# Patient Record
Sex: Male | Born: 1950 | Race: White | Hispanic: No | Marital: Married | Smoking: Never smoker
Health system: Southern US, Community
[De-identification: ages and names within clinical notes are randomized; demographics above are authoritative.]

## PROBLEM LIST (undated history)

## (undated) DIAGNOSIS — S6290XA Unspecified fracture of unspecified wrist and hand, initial encounter for closed fracture: Secondary | ICD-10-CM

## (undated) DIAGNOSIS — E119 Type 2 diabetes mellitus without complications: Secondary | ICD-10-CM

## (undated) DIAGNOSIS — E785 Hyperlipidemia, unspecified: Secondary | ICD-10-CM

## (undated) DIAGNOSIS — I4510 Unspecified right bundle-branch block: Secondary | ICD-10-CM

## (undated) DIAGNOSIS — I10 Essential (primary) hypertension: Secondary | ICD-10-CM

## (undated) DIAGNOSIS — C61 Malignant neoplasm of prostate: Secondary | ICD-10-CM

## (undated) DIAGNOSIS — M109 Gout, unspecified: Secondary | ICD-10-CM

## (undated) HISTORY — PX: COLONOSCOPY, DIAGNOSTIC (SCREENING): SHX174

## (undated) HISTORY — DX: Unspecified fracture of unspecified wrist and hand, initial encounter for closed fracture: S62.90XA

## (undated) HISTORY — DX: Essential (primary) hypertension: I10

## (undated) HISTORY — DX: Unspecified right bundle-branch block: I45.10

## (undated) HISTORY — DX: Gout, unspecified: M10.9

## (undated) HISTORY — PX: ARTHROSCOPY KNEE, MENISCUS REPAIR: SHX51041

## (undated) HISTORY — DX: Malignant neoplasm of prostate: C61

## (undated) HISTORY — PX: EGD: SHX3789

## (undated) HISTORY — DX: Hyperlipidemia, unspecified: E78.5

## (undated) HISTORY — DX: Type 2 diabetes mellitus without complications: E11.9

---

## 1997-09-24 ENCOUNTER — Ambulatory Visit: Admission: RE | Admit: 1997-09-24 | Payer: Self-pay | Source: Ambulatory Visit | Admitting: Gastroenterology

## 2005-03-11 ENCOUNTER — Ambulatory Visit
Admit: 2005-03-11 | Disposition: A | Discharge status: Home / Self Care | Payer: Self-pay | Source: Ambulatory Visit | Admitting: Orthopaedic Surgery

## 2006-02-04 ENCOUNTER — Ambulatory Visit
Admit: 2006-02-04 | Disposition: A | Discharge status: Home / Self Care | Payer: Self-pay | Source: Ambulatory Visit | Admitting: Orthopaedic Surgery

## 2010-10-29 ENCOUNTER — Ambulatory Visit
Admit: 2010-10-29 | Disposition: A | Discharge status: Home / Self Care | Payer: Self-pay | Source: Ambulatory Visit | Admitting: Internal Medicine

## 2014-01-15 ENCOUNTER — Encounter (INDEPENDENT_AMBULATORY_CARE_PROVIDER_SITE_OTHER): Payer: Self-pay | Admitting: Cardiovascular Disease

## 2014-02-05 ENCOUNTER — Ambulatory Visit (INDEPENDENT_AMBULATORY_CARE_PROVIDER_SITE_OTHER): Payer: BLUE CROSS/BLUE SHIELD | Admitting: Cardiovascular Disease

## 2014-02-05 ENCOUNTER — Encounter (INDEPENDENT_AMBULATORY_CARE_PROVIDER_SITE_OTHER): Payer: Self-pay | Admitting: Cardiovascular Disease

## 2014-02-05 ENCOUNTER — Other Ambulatory Visit (INDEPENDENT_AMBULATORY_CARE_PROVIDER_SITE_OTHER): Payer: Self-pay | Admitting: Cardiovascular Disease

## 2014-02-05 VITALS — BP 180/80 | HR 76 | Ht 74.0 in | Wt 221.0 lb

## 2014-02-05 DIAGNOSIS — E119 Type 2 diabetes mellitus without complications: Secondary | ICD-10-CM | POA: Insufficient documentation

## 2014-02-05 DIAGNOSIS — I10 Essential (primary) hypertension: Secondary | ICD-10-CM

## 2014-02-05 DIAGNOSIS — I4510 Unspecified right bundle-branch block: Secondary | ICD-10-CM

## 2014-02-05 DIAGNOSIS — R079 Chest pain, unspecified: Secondary | ICD-10-CM

## 2014-02-05 NOTE — Progress Notes (Signed)
[facility] CONSULTATION    I had the pleasure of seeing Mr. Jagoda today for cardiovascular evaluation. He is a pleasant 63 y.o. male with a history of labile hypertension, type 2 diabetes, gout, and a history of a right bundle branch block. He is here for discussion regarding his hypertension and chest discomfort symptoms. He brings with him extensive notes over the past 15 years.    He tells me that in 2000, he was told of labile hypertension but it was only intermittent and he was not started on medications. Notably, in 2012, he had a stress test or his systolic blood pressure reached 239 mmHg with exercise. The stress echo itself was within normal limits. He was not started on blood pressure medication at that time, however. In 2013, when he was diagnosed with diabetes he was also started on lisinopril but the dose was quickly escalated to a maximum. In November of 2014, he was started on metoprolol and his dose was also doubled but still without significant change in his blood pressure. Now, his endocrinologist would like to add HCTZ to his regimen and he is reluctant to do so and he is worried about about why his blood pressure is so difficult to control.    He tells me that he has a home blood pressure cuff that has been calibrated and he gets results similar to what he is seen at doctor's offices which tend to be with systolic blood pressures in the 150s and 160s. He also reports occasional chest discomfort symptoms. He reports it as a squeezing or pressure like sensation that typically occurs at rest but may be more notable on days when he exercises at the gym. He denies that he has the symptoms while on the treadmill for 20 minutes, however. It occurs once a week on average and not every time he exercises. He last for seconds only. He had a similar chest discomfort in 2012 which prompted a stress echocardiogram which was reportedly normal.    PAST MEDICAL HISTORY: He has a past  medical history of Diabetes mellitus; Hypertension; Gout; and RBBB. He has past surgical history that includes EGD; ARTHROSCOPY KNEE, MENISCUS REPAIR; and Colonoscopy.    MEDICATIONS:   Current Outpatient Prescriptions   Medication Sig Dispense Refill   . allopurinol (ZYLOPRIM) 300 MG tablet Take 300 mg by mouth daily.     Marland Kitchen GLIPIZIDE PO Take 4 mg by mouth 2 (two) times daily.     Marland Kitchen lisinopril (PRINIVIL,ZESTRIL) 40 MG tablet Take 40 mg by mouth daily.     . metFORMIN (GLUCOPHAGE-XR) 500 MG 24 hr tablet Take 1,000 mg by mouth 2 (two) times daily.     . metoprolol XL (TOPROL-XL) 50 MG 24 hr tablet Take 50 mg by mouth 2 (two) times daily.       No current facility-administered medications for this visit.       ALLERGIES:   Allergies   Allergen Reactions   . Penicillins        FAMILY HISTORY: His family history includes Lymphoma (age of onset: 59) in his father.    SOCIAL HISTORY: He reports that he has never smoked. He has never used smokeless tobacco. He reports that he drinks about 4.8 oz of alcohol per week. He reports that he does not use illicit drugs.    REVIEW OF SYSTEMS: All other systems reviewed and negative except as stated above.     PHYSICAL EXAMINATION  General Appearance:  A well-appearing  male in no acute distress.    Vital Signs: BP 180/80 mmHg  Pulse 76  Ht 1.88 m (6\' 2" )  Wt 100.245 kg (221 lb)  BMI 28.36 kg/m2   HEENT: Sclera anicteric, conjunctiva without pallor, moist mucous membranes, normal dentition. No arcus.   Neck:  Supple without jugular venous distention. Thyroid nonpalpable. Normal carotid upstrokes without bruits.   Chest: Clear to auscultation bilaterally with good air movement and respiratory effort and no wheezes, rales, or rhonchi   Cardiovascular: RRR, normal S1 and physiologically split S2 without murmurs, gallops or rub. PMI of normal size and nondisplaced.   Abdomen: Soft, nontender, nondistended, with normoactive bowel sounds. No organomegaly.  No pulsatile masses, or  bruits.   Extremities: Warm without edema, clubbing, or cyanosis. All peripheral pulses are full and equal.   Skin: No rash, xanthoma or xanthelasma.   Neuro: Alert and oriented x3. Grossly intact. Strength is symmetrical. Normal mood and affect.     ECG: normal sinus rhythm, left axis deviation, right bundle branch block with nonspecific ST changes, no previous is available for comparison.    LABS: 12/16/13: Total cholesterol 150, triglycerides 74, HDL 50, LDL 85, glucose 118, BUN 15, creatinine 0.9, sodium 141, potassium 4.2, hemoglobin A1c 6.5%    STRESS ECHOCARDIOGRAM: 10/29/10:  1. Excellent functional capacity.  2. No chest pain with exercise.  3. 1.5 mm of up sloping ST depression noted with exercise.  4. Baseline right bundle-branch block with repolarization abnormality.  Non- diagnostic EKG changes.    CONCLUSION:  1. Baseline echo images showed normal LV function with no regional wall  motion abnormalities. EF 60-65%.  2. With exercise, all walls recruited appropriately. Hyperdynamic LV  function with an EF of 80-85%.  3. Normal treadmill stress echocardiogram with no evidence of ischemia    IMPRESSION/RECOMMENDATIONS: Mr. Huaracha is a 63 y.o. male with CAD risk factors including age, hypertension, and diabetes who presents with difficult to control hypertension and atypical chest discomfort symptoms. Despite going on increasing doses of medications, he still has not had optimal control. He has not had a secondary workup to date. At this time I've asked him to do so to include a renal artery ultrasound and laboratory studies. In the event that they are within normal limits, we will assume that this is essential hypertension which is labile. I do feel that some of this may be tied into his emotions, however he reports that they are still elevated at home.    His chest discomfort is atypical in that it occurs at rest and not with exertion and last for seconds only. He does have a right bundle branch block which  eventually a stress echocardiogram in 2012. I've asked him to do a nuclear stress test for further evaluation. On examination today, he did have different blood pressures in each arm with the left arm eating significantly lower at 140/80 versus the right arm at 180/80 mmHg. These were checked 3 times still showed a difference. I've asked him to complete a chest CT scan with contrast to rule out dissection which also may be causing his chest discomfort symptoms.    In the interim he is going to take the HCTZ and follow his blood pressure in both arms at home. We will follow up in one month at which time we will discuss these results and any further steps.    PLAN:  1. Renal artery ultrasound  2. Laboratory studies for secondary hypertension  3. Nuclear stress test  4. CT angiogram to rule out dissection  5. Patient to start taking HCTZ  6. Follow blood pressures twice daily in both arms and record  7. Office visit in one month

## 2014-02-10 ENCOUNTER — Inpatient Hospital Stay
Admission: RE | Admit: 2014-02-10 | Discharge: 2014-02-10 | Disposition: A | Discharge status: Home / Self Care | Payer: BLUE CROSS/BLUE SHIELD | Source: Ambulatory Visit | Attending: Cardiovascular Disease | Admitting: Cardiovascular Disease

## 2014-02-10 VITALS — Ht 74.02 in | Wt 223.1 lb

## 2014-02-10 DIAGNOSIS — R079 Chest pain, unspecified: Secondary | ICD-10-CM | POA: Insufficient documentation

## 2014-02-10 DIAGNOSIS — I4510 Unspecified right bundle-branch block: Secondary | ICD-10-CM

## 2014-02-10 MED ORDER — TECHNETIUM TC 99M TETROFOSMIN INJECTION
1.0000 | Freq: Once | Status: AC | PRN
Start: 2014-02-10 — End: 2014-02-10
  Administered 2014-02-10: 1 via INTRAVENOUS
  Filled 2014-02-10: qty 1

## 2014-02-10 NOTE — Procedures (Signed)
Re: Noah Wells Sex: Male      DOB: 1951/05/05      Study #  [id number]   Referring Physician:  Cheri Kearns, MD       Rendering Physician:  Payton Doughty, MD  Test Date:  02/10/2014    Interpretation Date: 02/10/2014   Reason for the study:  Chest pain.    Myoview Perfusion Study:  The patient was injected with 10.87 mCi of Myoview (IV Location (Rest): Rt/AC) and SPECT imaging was begun approximately 30 minutes post injection. The camera obtained images with a 180-degree orbit around the patient's heart. After the rest images, the patient was then prepped for an exercise stress test. One minute prior to the termination of exercise, the patient was injected intravenously with 38.2 mCi of Myoview (IV Location (Stress): Rt/AC). Gated SPECT imaging was started at least 30 minutes post injection. The patient was placed in the supine position and the images were obtained with a 180-degree orbit around the patient's heart while gating to the patient's ECG. The study was then processed and displayed for evaluation.    Exercise:  Resting ECG:  Normal sinus rhythm; right bundle branch block.  Blood Pressure:  Rest = 120/84  Maximum = 188/68  Heart Rate:   Rest = 62  Maximum = 151   Heart rate 151 (95% of MPHR) at time of isotope injection.  Exercise stopped because of fatigue.  No chest pain, arrhythmias, or ischemic ECG changes.  Negative maximal treadmill exercise stress test.    Scintigraphic Interpretation:  The study is of good technical quality, with no evidence of significant motion artifact nor of significant soft tissue attenuation artifact.  At rest, there appears to be normal and symmetric distribution of activity throughout the left ventricular myocardium.  Following exercise, the images appear similar to those at rest.  Left ventricular size is normal.  Left ventricular ejection fraction is normal (51% at rest and 57% post exercise, normal being 50% or greater).  No regional wall motion abnormalities are  evident.    Conclusions:  1.  Normal scintigraphy, without evidence of fixed or inducible perfusion defects.  2.  Normal left ventricular size.  3.  Normal left ventricular ejection fraction (51%), without regional wall motion abnormalities.  4.  No prior studies are available for comparison.    Interpreted by: Payton Doughty, MD, [facility], [facility]  Myoview Activity: 10.87 mCi at [phone] (IV Location (Rest): Rt/AC)   Myoview Activity: 38.2 mCi at 0930 (IV Location (Stress): Rt/AC)

## 2014-02-17 ENCOUNTER — Telehealth (INDEPENDENT_AMBULATORY_CARE_PROVIDER_SITE_OTHER): Payer: Self-pay

## 2014-02-17 NOTE — Telephone Encounter (Signed)
Nuclear stress test- Normal study       Pt aware

## 2014-02-18 ENCOUNTER — Ambulatory Visit
Admission: RE | Admit: 2014-02-18 | Discharge: 2014-02-18 | Disposition: A | Discharge status: Home / Self Care | Payer: BLUE CROSS/BLUE SHIELD | Source: Ambulatory Visit | Attending: Cardiovascular Disease | Admitting: Cardiovascular Disease

## 2014-02-18 ENCOUNTER — Ambulatory Visit (HOSPITAL_BASED_OUTPATIENT_CLINIC_OR_DEPARTMENT_OTHER)
Admission: RE | Admit: 2014-02-18 | Discharge: 2014-02-18 | Disposition: A | Discharge status: Home / Self Care | Payer: BLUE CROSS/BLUE SHIELD | Source: Ambulatory Visit | Attending: Cardiovascular Disease | Admitting: Cardiovascular Disease

## 2014-02-18 DIAGNOSIS — I10 Essential (primary) hypertension: Secondary | ICD-10-CM | POA: Insufficient documentation

## 2014-02-18 DIAGNOSIS — R079 Chest pain, unspecified: Secondary | ICD-10-CM | POA: Insufficient documentation

## 2014-02-18 DIAGNOSIS — I77819 Aortic ectasia, unspecified site: Secondary | ICD-10-CM | POA: Insufficient documentation

## 2014-02-18 LAB — CORTISOL: Cortisol: 7.4 ug/dL

## 2014-02-18 LAB — PROLACTIN: Prolactin: 5.6 ng/mL (ref 3.5–19.4)

## 2014-02-18 LAB — TSH: TSH: 2.26 u[IU]/mL (ref 0.35–4.94)

## 2014-02-18 MED ORDER — IOHEXOL 350 MG/ML IV SOLN
94.0000 mL | Freq: Once | INTRAVENOUS | Status: AC | PRN
Start: 2014-02-18 — End: 2014-02-18
  Administered 2014-02-18: 94 mL via INTRAVENOUS

## 2014-02-19 ENCOUNTER — Telehealth (INDEPENDENT_AMBULATORY_CARE_PROVIDER_SITE_OTHER): Payer: Self-pay

## 2014-02-19 NOTE — Telephone Encounter (Signed)
-----   Message from Cheri Kearns, MD sent at 02/18/2014  3:47 PM EDT -----  Secondary work up labs for HTN, so far negative  ----- Message -----     From: Leory Plowman, [facility] [place]     Sent: 02/18/2014   2:44 PM       To: Cheri Kearns, MD

## 2014-02-19 NOTE — Telephone Encounter (Signed)
-----   Message from Cheri Kearns, MD sent at 02/18/2014  1:05 PM EDT -----  No renal artery stenosis.  No dissection.  Mildly dilated aorta.  No change in regimen for now.  ----- Message -----     From: Interface, Rad Results In     Sent: 02/18/2014  10:45 AM       To: Cheri Kearns, MD

## 2014-02-19 NOTE — Telephone Encounter (Signed)
Pt aware.

## 2014-02-20 LAB — ALDOSTERONE: Aldosterone, LC/MS/MS: <1 ng/dL

## 2014-02-21 LAB — ALDOSTERONE/PLASMA RENIN ACTIVITY RATIO
ALDO/PRA Ratio: 8 (ref 0.9–28.9)
Aldosterone: 2 ng/dL
PRA, LC/MS/MS: 0.25 (ref 0.25–5.82)

## 2014-02-21 LAB — CATECHOLAMINES, FRACTIONATED, PLASMA
Catecholamines, Total: 745 pg/mL
Epinephrine: 41 pg/mL
Norepinephrine: 704 pg/mL

## 2014-02-26 ENCOUNTER — Encounter (INDEPENDENT_AMBULATORY_CARE_PROVIDER_SITE_OTHER): Payer: Self-pay | Admitting: Cardiovascular Disease

## 2014-02-26 ENCOUNTER — Ambulatory Visit (INDEPENDENT_AMBULATORY_CARE_PROVIDER_SITE_OTHER): Payer: BLUE CROSS/BLUE SHIELD | Admitting: Cardiovascular Disease

## 2014-02-26 VITALS — BP 136/80 | HR 68 | Wt 220.6 lb

## 2014-02-26 DIAGNOSIS — I10 Essential (primary) hypertension: Secondary | ICD-10-CM

## 2014-02-26 NOTE — Progress Notes (Signed)
[facility]SULT[facility]    I had the pleasure of seeing Mr. Swaim today for cardiovascular evaluation. He is a pleasant 63 y.o. male with a history of labile hypertension, type 2 diabetes, gout, and a history of a right bundle branch block. He presented for evaluation and assistance with controlling his blood pressure as well as chest discomfort symptoms.we completed a number of tests and he presents today for followup.    First chest discomfort, he had a nuclear stress test that returned normal without evidence of ischemia or scarring. We completed a secondary workup for hypertension including laboratory studies and a renal artery ultrasound. These all returned within normal limits. Lastly, because he had different blood pressures in each arm, he had a CT angiogram to rule out dissection. This returned without evidence of dissection but did show a moderately dilated aortic root at 4.5 cm and a mildly dilated if any aorta at 3.6 cm.     After the test results came back, he just started taking the hydrochlorothiazide last week.he tells me that he is been tolerating it well without any side effects. He is been tracking his blood pressure which has ranged from a systolic of 1:15 to 162 on one occasion. Systolic blood pressures are predominantly in the 130s, however.    During the stress test, he did not have any chest discomfort, however when under the camera but his arm lifted up he did have a pulling sensation in his left side. He did not recur.    MEDICATIONS:   Current Outpatient Prescriptions   Medication Sig Dispense Refill   . allopurinol (ZYLOPRIM) 300 MG tablet Take 300 mg by mouth daily.     Marland Kitchen GLIPIZIDE XL 5 MG 24 hr tablet Take 5 mg by mouth 2 (two) times daily.     1   . hydrochlorothiazide (HYDRODIURIL) 25 MG tablet Take 25 mg by mouth daily.     0   . lisinopril (PRINIVIL,ZESTRIL) 40 MG tablet Take 40 mg by mouth daily.     . metFORMIN (GLUCOPHAGE-XR) 500 MG 24 hr tablet Take 1,000  mg by mouth 2 (two) times daily.     . metoprolol XL (TOPROL-XL) 50 MG 24 hr tablet Take 50 mg by mouth 2 (two) times daily.       No current facility-administered medications for this visit.     REVIEW OF SYSTEMS: All other systems reviewed and negative except as stated above.     PHYSICAL EXAMINATION  General Appearance:  A well-appearing male in no acute distress.    Vital Signs: BP 136/80 mmHg  Pulse 68  Wt 100.064 kg (220 lb 9.6 oz)   The remainder of the physical examination was deferred.    IMPRESSION/RECOMMENDATIONS: Mr. Burbano is a 63 y.o. male with CAD risk factors including age, hypertension, and diabetes who presented with difficult to control hypertension and atypical chest discomfort symptoms. Workup of secondary causes of hypertension was negative. A nuclear stress test was also normal. He did not have any evidence of dissection. The CT angiogram did show some secondary signs of hypertension including a dilated aortic root. That will need to be followed with serial examinations, likely echocardiograms to reduce radiation exposure.    He is only recently started taking hydrochlorothiazide and to date the blood pressures have been okay. I have not made any changes to his current regimen. It is hoped that with continued exercise and weight loss that he can stay on a stable regimen  without new additions. In the event that something more is necessary, I would likely consider amlodipine. At this time, I've held off. We will meet again in approximately 2 months at which time we will review his blood pressures and see if anything more is needed.    PLAN:  1. Continue current medication regimen  2. Track blood pressures and record  3. Office visit in 2 months

## 2014-06-04 ENCOUNTER — Encounter (INDEPENDENT_AMBULATORY_CARE_PROVIDER_SITE_OTHER): Payer: BLUE CROSS/BLUE SHIELD | Admitting: Cardiovascular Disease

## 2015-10-12 ENCOUNTER — Ambulatory Visit (INDEPENDENT_AMBULATORY_CARE_PROVIDER_SITE_OTHER): Payer: BLUE CROSS/BLUE SHIELD | Admitting: Cardiovascular Disease

## 2015-10-12 ENCOUNTER — Encounter (INDEPENDENT_AMBULATORY_CARE_PROVIDER_SITE_OTHER): Payer: Self-pay | Admitting: Cardiovascular Disease

## 2015-10-12 ENCOUNTER — Ambulatory Visit
Admission: RE | Admit: 2015-10-12 | Discharge: 2015-10-12 | Disposition: A | Discharge status: Home / Self Care | Payer: BLUE CROSS/BLUE SHIELD | Source: Ambulatory Visit | Attending: Cardiovascular Disease | Admitting: Cardiovascular Disease

## 2015-10-12 VITALS — BP 146/86 | HR 67 | Resp 16 | Ht 74.0 in | Wt 221.0 lb

## 2015-10-12 DIAGNOSIS — R9431 Abnormal electrocardiogram [ECG] [EKG]: Secondary | ICD-10-CM

## 2015-10-12 DIAGNOSIS — I77810 Thoracic aortic ectasia: Secondary | ICD-10-CM | POA: Insufficient documentation

## 2015-10-12 DIAGNOSIS — I10 Essential (primary) hypertension: Secondary | ICD-10-CM

## 2015-10-12 DIAGNOSIS — I420 Dilated cardiomyopathy: Secondary | ICD-10-CM | POA: Insufficient documentation

## 2015-10-12 DIAGNOSIS — I083 Combined rheumatic disorders of mitral, aortic and tricuspid valves: Secondary | ICD-10-CM | POA: Insufficient documentation

## 2015-10-12 NOTE — Patient Instructions (Signed)
Complete echocardiogram    Continue to watch blood pressure call if remains elevated    Calibrate home blood pressure cuff    Please have labs forwarded fax [phone]    Hydrate well    Follow up 1 month

## 2015-10-12 NOTE — Progress Notes (Signed)
[facility]SULT[facility]    I had the pleasure of seeing Noah Wells today for cardiovascular evaluation. He is a pleasant 65 y.o. male with a history of labile hypertension, type 2 diabetes, gout, and a history of a right bundle branch block. I last saw him in the summer of 2015, at which time his blood pressure was high normal and we contemplated starting a new agent versus increasing his HCTZ. He presents now for an office visit.    He tells me that all has been going well until a few months ago when he fell and broke his right wrist.  He got the last month and has been doing physical therapy.  He started noticing towards the end of last month he had numbness in his right hand and fingers.  He can reproduce it in the shower with certain movements.  He was worried about any cardiac correlation.    And then, 2 weeks ago, [facility]  After feeling tired, sleeping excessively, cold and with abdominal pain in his left lower abdomen.  At the clinic, his blood pressure was noted to be 130/50 with a heart rate of 48.  An EKG showed a right bundle branch block which was not new, but they got very excited.  He was ultimately discharged but still feels tired, cold, and has little bit of abdominal discomfort.  He admits to some diarrhea.  He denies fevers, chills or cough.  He took Tylenol one day but is not sure if it helped.  He feels that it is very mildly better now.  He denies any chest discomfort.  Despite these symptoms, he was still able to go to the gym and exercises.  He would do 20 minutes on the treadmill at 3.5 miles per hour at a 3.8 percent grade.  He also did stretching.  With these activities, he felt fine.  He denies any lower extremity edema, orthopnea or paroxysmal nocturnal dyspnea.  He has not had any palpitations.    He has been checking his blood pressures at home over the past week and they are running a little bit lower than normal with systolics in the 100s to  140s and diastolics in the 60s to 80s.  He more typically sees it closer to 140 mmHg.    MEDICATIONS:   Current Outpatient Prescriptions   Medication Sig Dispense Refill   . allopurinol (ZYLOPRIM) 300 MG tablet Take 300 mg by mouth daily.     Marland Kitchen GLIPIZIDE XL 5 MG 24 hr tablet Take 5 mg by mouth 2 (two) times daily.     1   . hydrochlorothiazide (HYDRODIURIL) 25 MG tablet Take 25 mg by mouth daily.     0   . lisinopril (PRINIVIL,ZESTRIL) 40 MG tablet Take 40 mg by mouth daily.     . metFORMIN (GLUCOPHAGE-XR) 500 MG 24 hr tablet Take 1,000 mg by mouth 2 (two) times daily.     . metoprolol (TOPROL-XL) 100 MG 24 hr tablet Take 100 mg by mouth daily.          No current facility-administered medications for this visit.     REVIEW OF SYSTEMS: All other systems reviewed and negative except as stated above.     PHYSICAL EXAMINATION  General Appearance:  A well-appearing male in no acute distress.    Vital Signs: BP 146/86 mmHg  Pulse 67  Resp 16  Ht 1.88 m (6\' 2" )  Wt 100.245 kg (221  lb)  BMI 28.36 kg/m2   HEENT: Sclera anicteric, conjunctiva without pallor, moist mucous membranes, normal dentition. No arcus.    Neck: Supple without jugular venous distention. Thyroid nonpalpable. Normal carotid upstrokes without bruits.    Chest: Clear to auscultation bilaterally with good air movement and respiratory effort and no wheezes, rales, or rhonchi    Cardiovascular: RRR, normal S1 and physiologically split S2 without murmurs, gallops or rub. PMI of normal size and nondisplaced.    Abdomen: Soft, nontender, nondistended, with normoactive bowel sounds. No organomegaly.  No pulsatile masses, or bruits.    Extremities: Warm without edema, clubbing, or cyanosis. All peripheral pulses are full and equal.    Skin: No rash, xanthoma or xanthelasma.    Neuro: Alert and oriented x3. Grossly intact. Strength is symmetrical. Normal mood and affect.     EKG: Normal sinus rhythm occasional PVC's, left axis deviation, left anterior  fascicular block, Right bundle branch block, compared to prior study 02/05/2014, PVC's noted.    Labs 09/2015: Sodium 141, potassium 4.3,glucose 196, BUN 11, Creatinine 0.9,WBC 12.5    IMPRESSION/RECOMMENDATIONS: Noah Wells is a 65 y.o. male with CAD risk factors including age, hypertension, and diabetes who presented with nonspecific symptoms.  His current symptoms almost sound viral as if he had some cold, given that he was having abdominal discomfort and diarrhea and just felt tired and fatigued.  At this time, I have recommended hydration.  We last completed a nuclear stress test 2 years ago which was within normal limits.  Given that he is able to continue to exercise without any symptoms, I am less suspicious of an ischemic cause.    He does have PVCs on his EKG today, which is new compared to previous.  I am not sure if his diarrhea is causing low potassium levels.  He tells me that he had blood work drawn a few weeks ago.  We will try and request a copy of that for records.  Lipids were also drawn at that time and we can discuss a statin. Given his diabetes, from a cardiac perspective, he should be on a statin.  Once I get his numbers, I will calculate a cardiovascular risk score, but I feel confident that with diabetes should be on something.     I have asked him to repeat an echocardiogram to follow-up on his aortic aneurysm.  If he has any aortic insufficiency.  She is also covering his home blood pressure cuff in for calibration.  He will follow-up in one month.    PLAN:  1. Echocardiogram  2.  Obtain previous labs to include lipids, and chemistry panel  3.  Continue exercising as tolerated  4.  Would strongly consider starting a statin  5.  Office visit and follow up in one month

## 2015-10-13 ENCOUNTER — Telehealth (INDEPENDENT_AMBULATORY_CARE_PROVIDER_SITE_OTHER): Payer: Self-pay

## 2015-10-13 DIAGNOSIS — E785 Hyperlipidemia, unspecified: Secondary | ICD-10-CM

## 2015-10-13 MED ORDER — ATORVASTATIN CALCIUM 20 MG PO TABS
20.0000 mg | ORAL_TABLET | Freq: Every day | ORAL | Status: DC
Start: 2015-10-13 — End: 2015-11-20

## 2015-10-13 NOTE — Telephone Encounter (Signed)
Labs reviewed-CV risk score 24%- high intensity statin    Would start Lipitor 20 mg to start  Recheck in 1 month      Pt aware

## 2015-10-19 ENCOUNTER — Encounter (INDEPENDENT_AMBULATORY_CARE_PROVIDER_SITE_OTHER): Payer: Self-pay

## 2015-11-20 ENCOUNTER — Ambulatory Visit (INDEPENDENT_AMBULATORY_CARE_PROVIDER_SITE_OTHER): Payer: BLUE CROSS/BLUE SHIELD | Admitting: Cardiovascular Disease

## 2015-11-20 ENCOUNTER — Encounter (INDEPENDENT_AMBULATORY_CARE_PROVIDER_SITE_OTHER): Payer: Self-pay | Admitting: Cardiovascular Disease

## 2015-11-20 VITALS — BP 172/94 | HR 68 | Resp 16 | Ht 74.0 in | Wt 220.0 lb

## 2015-11-20 DIAGNOSIS — E785 Hyperlipidemia, unspecified: Secondary | ICD-10-CM

## 2015-11-20 DIAGNOSIS — I10 Essential (primary) hypertension: Secondary | ICD-10-CM

## 2015-11-20 MED ORDER — ATORVASTATIN CALCIUM 20 MG PO TABS
20.0000 mg | ORAL_TABLET | Freq: Every day | ORAL | Status: DC
Start: 2015-11-20 — End: 2016-11-30

## 2015-11-20 NOTE — Progress Notes (Signed)
[facility]SULT[facility]    I had the pleasure of seeing Noah Wells today for cardiovascular evaluation. He is a pleasant 66 y.o. male with a history of labile hypertension, type 2 diabetes, gout, and a history of a right bundle branch block.  I saw him in consultation in late February, at which time he had an EKG which showed a right bundle branch block.  It was not new, but it was felt that he should have additional evaluation given his risk factors.  He also has a history of aortic root aneurysm that has not been followed and therefore we completed an echocardiogram. Based on his risk factors, I felt that he should likely be on a statin.  I asked him to repeat some laboratory studies.  He comes in today for further discussion.    When I reviewed his laboratory studies, I calculated a 10 year cardiovascular risk score.  His risk was significantly elevated at 24 percent and I recommended a statin.  He wanted to come in today for further discussion.  He brought in a 15 page PowerPoint discussion and review regarding his medical history and cholesterol history.  He just wanted to make sure that he has not been clear.    He tells me that he is seen other doctors over the course of the past few months and his blood pressures have been in the 120s over 70s.  At home, they have also been well controlled.      MEDICATIONS:   Current Outpatient Prescriptions   Medication Sig Dispense Refill   . allopurinol (ZYLOPRIM) 300 MG tablet Take 300 mg by mouth daily.     Marland Kitchen GLIPIZIDE XL 5 MG 24 hr tablet Take 5 mg by mouth 2 (two) times daily.     1   . hydrochlorothiazide (HYDRODIURIL) 25 MG tablet Take 25 mg by mouth daily.     0   . lisinopril (PRINIVIL,ZESTRIL) 40 MG tablet Take 40 mg by mouth daily.     . metFORMIN (GLUCOPHAGE-XR) 500 MG 24 hr tablet Take 1,000 mg by mouth 2 (two) times daily.     . metoprolol (TOPROL-XL) 100 MG 24 hr tablet Take 100 mg by mouth daily.          No current  facility-administered medications for this visit.     REVIEW OF SYSTEMS: All other systems reviewed and negative except as stated above.     PHYSICAL EXAMINATION  General Appearance:  A well-appearing male in no acute distress.    Vital Signs: BP 172/94 mmHg  Pulse 68  Resp 16  Ht 1.88 m (6\' 2" )  Wt 99.791 kg (220 lb)  BMI 28.23 kg/m2  SpO2 98%   The remainder of the physical examination has been deferred.    ECHO: 10/12/15:  Conclusions:     1. Normal left ventricular systolic function. Estimated ejection fraction 51.4%.     2. Mild right ventricular dilation.     3. Normal right ventricular function.     4. Mild right atrial dilation.     5. Moderate aortic root dilation. 4.3 cm     6. Mild ascending aorta dilation. 3.9 cm     7. Trace aortic regurgitation.     8. Mild mitral regurgitation.     9. Trace tricuspid regurgitation.     10. Normal estimated pulmonary artery pressure.     11. No previous study is available for comparison. However, a limited stress echo on 10/29/10  also showed normal left heart size and function.    Labs: 10/08/15: Total cholesterol 135, triglycerides 65, HDL 41, LDL 81, (10 year cardiovascular risk score 24%), normal LFTs, glucose 127, BUN 24, creatinine 0.94, sodium 141, potassium 4.3, hemoglobin A1c 6.7 percent    IMPRESSION/RECOMMENDATIONS: Noah Wells is a 65 y.o. male with CAD risk factors including age, hypertension, and diabetes who presented with nonspecific symptoms.  We spent a significant amount of time discussing his presentation and the facts.  He actually had done a good amount of research and came to the same conclusion.  After looking at both [employer] and American Heart Association guidelines, he saw that the risk was elevated.  He did require some clarification as to the reason for the statin.  After a prolonged discussion, he  appears to understand.  At this time, he is amenable to starting, but would like to do so after a business trip that he has in June.  He will likely start atorvastatin 20 mg daily at that time and have laboratory studies.  One month later.  We will discuss his results.  At that time.    His echocardiogram continues to show a fairly stable aortic root and ascending aorta.  He had previously had a CT scan that is slightly different but not significantly.  As long as his blood pressure is under control, I think that is the most important thing at this time.    PLAN:  1.  Patient to start atorvastatin as discussed  2.  Laboratory studies one month after starting statin  3.  Echocardiogram in 1 year  4.  Follow-up in approximately 4 months

## 2015-11-20 NOTE — Patient Instructions (Addendum)
Start recommended Lipitor    Follow up blood work in 1 month after starting    Repeat echocardiogram in 1 year with follow up in 1 year

## 2016-11-14 ENCOUNTER — Ambulatory Visit
Admission: RE | Admit: 2016-11-14 | Discharge: 2016-11-14 | Disposition: A | Discharge status: Home / Self Care | Payer: BLUE CROSS/BLUE SHIELD | Source: Ambulatory Visit | Attending: Cardiovascular Disease | Admitting: Cardiovascular Disease

## 2016-11-14 DIAGNOSIS — I10 Essential (primary) hypertension: Secondary | ICD-10-CM

## 2016-11-14 DIAGNOSIS — I340 Nonrheumatic mitral (valve) insufficiency: Secondary | ICD-10-CM | POA: Insufficient documentation

## 2016-11-14 DIAGNOSIS — I77810 Thoracic aortic ectasia: Secondary | ICD-10-CM | POA: Insufficient documentation

## 2016-11-14 DIAGNOSIS — I119 Hypertensive heart disease without heart failure: Secondary | ICD-10-CM | POA: Insufficient documentation

## 2016-11-15 ENCOUNTER — Other Ambulatory Visit (INDEPENDENT_AMBULATORY_CARE_PROVIDER_SITE_OTHER): Payer: Self-pay | Admitting: Cardiovascular Disease

## 2016-11-16 LAB — HEPATIC FUNCTION PANEL
ALT: 35 U/L (ref 9–46)
AST (SGOT): 28 U/L (ref 10–35)
Albumin/Globulin Ratio: 2.2 (calc) (ref 1.0–2.5)
Albumin: 4.4 g/dL (ref 3.6–5.1)
Alkaline Phosphatase: 53 U/L (ref 40–115)
Bilirubin Direct: 0.3 mg/dL — ABNORMAL HIGH (ref <=0.2)
Bilirubin Indirect: 0.9 mg/dL (calc) (ref 0.2–1.2)
Bilirubin, Total: 1.2 mg/dL (ref 0.2–1.2)
Globulin: 2 g/dL (calc) (ref 1.9–3.7)
Protein, Total: 6.4 g/dL (ref 6.1–8.1)

## 2016-11-16 LAB — LIPID PANEL
Cholesterol / HDL Ratio: 2.3 (calc) (ref <5.0)
Cholesterol: 103 mg/dL (ref <200)
HDL: 45 mg/dL (ref >40)
LDL Calculated: 42 mg/dL (calc)
NON HDL CHOLESTEROL: 58 mg/dL (calc) (ref <130)
Triglycerides: 75 mg/dL (ref <150)

## 2016-11-16 LAB — CK: Creatinine Kinase, Total: 102 U/L (ref 44–196)

## 2016-11-22 ENCOUNTER — Encounter (INDEPENDENT_AMBULATORY_CARE_PROVIDER_SITE_OTHER): Payer: BLUE CROSS/BLUE SHIELD | Admitting: Cardiovascular Disease

## 2016-11-23 ENCOUNTER — Telehealth (INDEPENDENT_AMBULATORY_CARE_PROVIDER_SITE_OTHER): Payer: Self-pay

## 2016-11-23 NOTE — Telephone Encounter (Signed)
Pt has appointment tomorrow.

## 2016-11-23 NOTE — Telephone Encounter (Signed)
-----   Message from Cheri Kearns, MD sent at 11/16/2016 10:01 AM EDT -----  Labs look great. No changes.   ----- Message -----  From: Janace Hoard Lab Results In  Sent: 11/16/2016   3:45 AM  To: Cheri Kearns, MD

## 2016-11-24 ENCOUNTER — Ambulatory Visit (INDEPENDENT_AMBULATORY_CARE_PROVIDER_SITE_OTHER): Payer: BLUE CROSS/BLUE SHIELD | Admitting: Cardiovascular Disease

## 2016-11-24 ENCOUNTER — Encounter (INDEPENDENT_AMBULATORY_CARE_PROVIDER_SITE_OTHER): Payer: Self-pay | Admitting: Cardiovascular Disease

## 2016-11-24 VITALS — BP 136/80 | HR 76 | Ht 74.0 in | Wt 224.0 lb

## 2016-11-24 DIAGNOSIS — I10 Essential (primary) hypertension: Secondary | ICD-10-CM

## 2016-11-24 DIAGNOSIS — R2681 Unsteadiness on feet: Secondary | ICD-10-CM

## 2016-11-24 DIAGNOSIS — E7800 Pure hypercholesterolemia, unspecified: Secondary | ICD-10-CM

## 2016-11-24 DIAGNOSIS — E785 Hyperlipidemia, unspecified: Secondary | ICD-10-CM | POA: Insufficient documentation

## 2016-11-24 NOTE — Progress Notes (Signed)
[facility]SULTATION    I had the pleasure of seeing [name] today for cardiovascular evaluation. He is a pleasant 66 y.o. male with a history of labile hypertension, type 2 diabetes, hyperlipidemia, gout, and a history of a right bundle branch block.  I saw him in consultation in 2015, at which time he had an EKG which showed a right bundle branch block.  A nuclear stress test was unremarkable.  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  I last saw him one year ago, at which time I recommended that he start a statin due to an elevated risk score.  He presents now for follow-up.    He tells me that he had been doing great until recently.  Since August, he is been having episodes where he feels off balance.  The episodes usually last around 2 minutes and then resolve on their own.  She reports the sudden need to search.  She feels "urge to fall down, high, and cover up".  He denies that the room is spinning or that he feels like he needs to pass out.  After about 2 minutes, his symptoms resolved.  It has occurred while walking the dog.  Sometimes he feels that his feet are wobbly and he is leaning to the left and staring at the ground.  He denies that he is dizzy or presyncopal, however.  He denies that he has associated symptoms including chest discomfort, shortness of breath, palpitations, nausea, sweatiness or appears pale.  Sometimes, there are visual triggers.  The worst episode occurred while he was on a tour in a tunnel, which was pitch black.  He felt confused and completely disoriented as to what was up down the left or right.  When he was looking up at break buildings that were lit in [place], he had a similar feeling.    Initially, he saw ENT who checked it in her ear and did a workup that was negative.  His endocrinologist did not think it was related to low sugars.  He saw a neurologist who reported  having seen a similar episode in the past.  He prescribed him Lexapro and asked him to return in a couple of weeks.  He has not had any change on Lexapro.    His symptoms are most reliably associated with walking at the [employer].  Something about the high ceilings and narrow files trigger one of these episodes.  He never had this issue in the past.  He also had an issue while in [facility]    He walks the dog for an hour a day.  He is on the treadmill for 20 minutes at 3.2 miles per hour and an 11 percent incline.  He also lifts weights.  He denies that he has any symptoms related to this.    His blood pressure is also been erratic, sometimes up, sometimes down.  Recently, his endocrinologist, double his Toprol due to elevated pressures.  His current symptoms, however predate that.  He has reported some diarrhea, but not consistent.  He started the atorvastatin in January, only and denies that his symptoms are related to it.  He did feel these symptoms prior to the initiation of atorvastatin.    MEDICATIONS:   Current Outpatient Prescriptions   Medication Sig Dispense Refill   . escitalopram (LEXAPRO) 10 MG tablet      . metoprolol succinate (TOPROL XL) 100  MG 24 hr tablet      . allopurinol (ZYLOPRIM) 300 MG tablet Take 300 mg by mouth daily.     Marland Kitchen atorvastatin (LIPITOR) 20 MG tablet Take 1 tablet (20 mg total) by mouth daily. 30 tablet 5   . GLIPIZIDE XL 5 MG 24 hr tablet Take 5 mg by mouth 2 (two) times daily.     1   . hydrochlorothiazide (HYDRODIURIL) 25 MG tablet Take 25 mg by mouth daily.     0   . lisinopril (PRINIVIL,ZESTRIL) 40 MG tablet Take 40 mg by mouth daily.     . metFORMIN (GLUCOPHAGE-XR) 500 MG 24 hr tablet Take 1,000 mg by mouth 2 (two) times daily.     . metoprolol (TOPROL-XL) 100 MG 24 hr tablet Take 100 mg by mouth daily.          No current facility-administered medications for this visit.      REVIEW OF SYSTEMS: All other systems reviewed and negative except as stated above.     PHYSICAL  EXAMINATION  General Appearance:  A well-appearing male in no acute distress.    Vital Signs: BP 166/76   Pulse 66   Ht 1.88 m (6\' 2" )   Wt 101.6 kg (224 lb)   BMI 28.76 kg/m  .  Lying: 166/96, 66.  Sitting: 158/82, 79  Standing: 136/80, 76  HEENT: Sclera anicteric, conjunctiva without pallor, moist mucous membranes, normal dentition. No arcus.    Neck: Supple without jugular venous distention. Thyroid nonpalpable. Normal carotid upstrokes without bruits.    Chest: Clear to auscultation bilaterally with good air movement and respiratory effort and no wheezes, rales, or rhonchi    Cardiovascular: RRR with occasional ectopic beats, sometimes in a pattern of bigeminy, normal S1 and physiologically split S2 without murmurs, gallops or rub. PMI diffuse.    Abdomen: Soft, nontender, nondistended, with normoactive bowel sounds. No organomegaly.  No pulsatile masses, or bruits.    Extremities: Warm without edema, clubbing, or cyanosis. All peripheral pulses are full and equal.    Skin: No rash, xanthoma or xanthelasma.    Neuro: Alert and oriented x3. Grossly intact. Strength is symmetrical. Normal mood and affect.     EKG : Normal sinus rhythm, occasional PVC, left axis deviation/left anterior fascicular block, right bundle branch block, non specific ST changes, compared to prior study 09/17/2015 no significant change     ECHO: 11/14/16:  1. Normal left ventricular systolic function. Estimated ejection fraction 57%.  2. Mild concentric left ventricular hypertrophy.  3. Grade I left ventricular diastolic dysfunction.  4. Mild-moderate aortic root dilation. 4.2 cm  5. Mild ascending aorta dilation. 3.8 cm  6. Trace aortic regurgitation.  7. Mild mitral regurgitation.  8. Trace to mild tricuspid regurgitation.  9. Compared to the prior study of 10/12/15, the left ventricle systolic function has increased from low normal to normal. The right heart size also appears normal. The aorta sizes are unchanged.    Labs: 11/16/16:  total cholesterol 103, triglycerides 75, HDL 45, LDL 42, normal LFTs, CK 102    IMPRESSION/RECOMMENDATIONS: Mr. Mizuno is a 66 y.o. male with CAD risk factors including age, hypertension, hyperlipidemia, and diabetes who presents with nonspecific symptoms.  Many of his symptoms sound neurologic.  Given that there are visual cues, and imbalance, and specifically no symptoms with exertion.  He has no prodrome including palpitations positional changes, or chest discomfort/shortness of breath.  I have asked him to wear a 48-hour Holter monitor.  During the monitoring period, he will go into [employer], which usually triggers his episodes.  We will also be able to quantify his ventricular ectopy to see if it has increased.  I am not sure that that is the source, however.    As noted above, his echocardiogram is unchanged from one year ago.  Given that he is not having episodes with exercise, I do not think a repeat stress test is warranted.  His blood pressure is high here, as it was previously.  I am unsure as to whether or not he has whitecoat hypertension.  I have asked him to check his blood pressure at home twice daily over the next week and to contact me.  In the event that he continues to have erratic, or high blood pressures, we can consider a secondary workup for hypertension.    In the interim, he will start taking enteric-coated baby aspirin daily.  He had not been doing so previously.  He will follow-up with neurology and continue testing.  I will see him thereafter.     PLAN:  1.  48 hour Holter monitor.  2.  Check blood pressures twice daily for one week.  3.  Enteric-coated baby aspirin daily.  4.  Follow-up with neurology.  5.  Follow-up here in one month.

## 2016-11-24 NOTE — Patient Instructions (Addendum)
Complete 48 hr holter monitor     Continue to follow blood pressures 2 x daily and send results     Continue to follow up with Neurology    Start Asprin enteric coated 81 mg daily      Follow up in 1 month

## 2016-11-25 ENCOUNTER — Ambulatory Visit (INDEPENDENT_AMBULATORY_CARE_PROVIDER_SITE_OTHER): Payer: BLUE CROSS/BLUE SHIELD

## 2016-11-25 DIAGNOSIS — E7800 Pure hypercholesterolemia, unspecified: Secondary | ICD-10-CM

## 2016-11-25 DIAGNOSIS — I10 Essential (primary) hypertension: Secondary | ICD-10-CM

## 2016-11-25 DIAGNOSIS — R2681 Unsteadiness on feet: Secondary | ICD-10-CM

## 2016-11-25 NOTE — Procedures (Signed)
Holter Monitor number [id number] was placed on the patient. Detailed instructions given. The patient verbalized understanding.

## 2016-11-30 ENCOUNTER — Other Ambulatory Visit (INDEPENDENT_AMBULATORY_CARE_PROVIDER_SITE_OTHER): Payer: Self-pay

## 2016-11-30 DIAGNOSIS — E785 Hyperlipidemia, unspecified: Secondary | ICD-10-CM

## 2016-11-30 DIAGNOSIS — I10 Essential (primary) hypertension: Secondary | ICD-10-CM

## 2016-11-30 MED ORDER — ATORVASTATIN CALCIUM 20 MG PO TABS
20.0000 mg | ORAL_TABLET | Freq: Every day | ORAL | 3 refills | Status: DC
Start: 2016-11-30 — End: 2018-01-03

## 2016-12-02 DIAGNOSIS — R2681 Unsteadiness on feet: Secondary | ICD-10-CM

## 2016-12-03 ENCOUNTER — Encounter (INDEPENDENT_AMBULATORY_CARE_PROVIDER_SITE_OTHER): Payer: Self-pay | Admitting: Cardiovascular Disease

## 2017-01-20 ENCOUNTER — Ambulatory Visit (INDEPENDENT_AMBULATORY_CARE_PROVIDER_SITE_OTHER): Payer: BLUE CROSS/BLUE SHIELD | Admitting: Cardiovascular Disease

## 2017-01-20 ENCOUNTER — Encounter (INDEPENDENT_AMBULATORY_CARE_PROVIDER_SITE_OTHER): Payer: Self-pay | Admitting: Cardiovascular Disease

## 2017-01-20 ENCOUNTER — Encounter (INDEPENDENT_AMBULATORY_CARE_PROVIDER_SITE_OTHER): Payer: BLUE CROSS/BLUE SHIELD | Admitting: Cardiovascular Disease

## 2017-01-20 VITALS — BP 112/56 | HR 50 | Ht 74.0 in | Wt 228.0 lb

## 2017-01-20 DIAGNOSIS — I493 Ventricular premature depolarization: Secondary | ICD-10-CM

## 2017-01-20 DIAGNOSIS — E7800 Pure hypercholesterolemia, unspecified: Secondary | ICD-10-CM

## 2017-01-20 DIAGNOSIS — I10 Essential (primary) hypertension: Secondary | ICD-10-CM

## 2017-01-20 DIAGNOSIS — I712 Thoracic aortic aneurysm, without rupture, unspecified: Secondary | ICD-10-CM

## 2017-01-20 NOTE — Progress Notes (Signed)
[facility]    I had the pleasure of seeing [name] today for cardiovascular evaluation. He is a pleasant 66 y.o. male with a history of labile hypertension, type 2 diabetes, hyperlipidemia, gout, and a history of a right bundle branch block.  I saw him in consultation in 2015, at which time he had an EKG which showed a right bundle branch block.  A nuclear stress test was unremarkable.  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  I last saw him in April, at which time he was having episodes where he did feel off balance, the need to fall down [place] and cover up.  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  As part of evaluation, had him wear a 48-hour monitor.  I also had him check his blood pressures which were elevated at the time of our last visit.  He presents now for follow-up.    The monitor showed that he was having a good burden of PVCs, up to 12 percent.  However, they did not necessarily correlate with his episodes.  He was noted to have bradycardia, but the lowest heart rate was 49 and also did not correlate with his symptoms.  He saw neurology who felt that it was related to panic attacks.  The patient had been on Lexapro but had not noticed any improvement after a month and stopped it.  He opted not to take anything else.  He did have recurrent events.  Each episode lasted about 5-10 minutes.    Despite having the heavy burden of PVCs, he denies that he feels palpitations at all.  In general, he has been feeling well.  He continues to work out on the treadmill and denies any symptoms with that.    MEDICATIONS:   Current Outpatient Prescriptions   Medication Sig Dispense Refill   . allopurinol (ZYLOPRIM) 300 MG tablet Take 300 mg by mouth daily.     Marland Kitchen aspirin EC 81 MG EC tablet Take 81 mg by mouth daily.     Marland Kitchen atorvastatin (LIPITOR) 20 MG tablet Take  1 tablet (20 mg total) by mouth daily. 90 tablet 3   . GLIPIZIDE XL 5 MG 24 hr tablet Take 10 mg by mouth 2 (two) times daily.      1   . hydrochlorothiazide (HYDRODIURIL) 25 MG tablet Take 25 mg by mouth daily.     0   . lisinopril (PRINIVIL,ZESTRIL) 40 MG tablet Take 40 mg by mouth daily.     . metFORMIN (GLUCOPHAGE-XR) 500 MG 24 hr tablet Take 1,000 mg by mouth 2 (two) times daily.     . metoprolol (TOPROL-XL) 100 MG 24 hr tablet Take 100 mg by mouth 2 (two) times daily.           No current facility-administered medications for this visit.      REVIEW OF SYSTEMS: All other systems reviewed and negative except as stated above.     PHYSICAL EXAMINATION  General Appearance:  A well-appearing male in no acute distress.    Vital Signs: BP 112/56   Pulse (!) 50   Ht 1.88 m (6\' 2" )   Wt 103.4 kg (228 lb)   SpO2 97%   BMI 29.27 kg/m    HEENT: Sclera anicteric, conjunctiva without pallor, moist mucous membranes, normal dentition. No arcus.    Neck: Supple without jugular venous  distention. Thyroid nonpalpable. Normal carotid upstrokes without bruits.    Chest: Clear to auscultation bilaterally with good air movement and respiratory effort and no wheezes, rales, or rhonchi    Cardiovascular: RRR with occasional ectopic beats, sometimes in a pattern of bigeminy, normal S1 and physiologically split S2 without murmurs, gallops or rub. PMI diffuse.    Abdomen: Soft, nontender, nondistended, with normoactive bowel sounds. No organomegaly.  No pulsatile masses, or bruits.    Extremities: Warm without edema, clubbing, or cyanosis. All peripheral pulses are full and equal.    Skin: No rash, xanthoma or xanthelasma.    Neuro: Alert and oriented x3. Grossly intact. Strength is symmetrical. Normal mood and affect.     48 HOUR HOLTER:11/25/2016-11/27/2016  1- Baseline SR with IVCD/BBB. Average heart rate was 66 bpm varying from 49 to 99 bpm. 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 Normal left ventricular systolic function. Estimated ejection fraction 57%.  2. Mild concentric left ventricular hypertrophy.  3. Grade I left ventricular diastolic dysfunction.  4. Mild-moderate aortic root dilation. 4.2 cm  5. Mild ascending aorta dilation. 3.8 cm  6. Trace aortic regurgitation.  7. Mild mitral regurgitation.  8. Trace to mild tricuspid regurgitation.  9. Compared to the prior study of 10/12/15, the left ventricle systolic function has increased from low normal to normal. The right heart size also appears normal. The aorta sizes are unchanged.    IMPRESSION/RECOMMENDATIONS: Noah Wells is a 66 y.o. male with CAD risk factors including age, hypertension, hyperlipidemia, and diabetes who presents with nonspecific symptoms.  I am in agreement that it could be related to a panic attack.  We did not note any significant arrhythmias during his typical episodes.  He has no evidence of ischemia or significant cardiomyopathy that is of concern.  He was not orthostatic on examination.  He denies any action feels lightheaded and presyncopal.  It also does not feel like vertigo.  At this time, he is to work on avoidance of his typical triggers but he is less concerned about it given that a neurologic and cardiac workup have not been fruitful.    He does have a fairly significant PVC burden to which he is asymptomatic.  He is already on a high-dose of metoprolol.  I did not make any changes for now.  I have asked him to repeat a 24-hour Holter monitor next year.  In event that he approaches 20 percent, we can pursue a PVC ablation.  Nothing is  indicated at this time.  He will also repeat an echocardiogram to follow up on his aortic aneurysms.  I will see him thereafter.    PLAN:  1.  Continue current medication regimen.  2.  24-hour Holter monitor next year.  3.  Echocardiogram next year.  4.  Follow up after testing next year.

## 2017-01-20 NOTE — Patient Instructions (Addendum)
Follow up echocardiogram in April    Diet,exercise,weight loss    Complete fasting labs in October     Follow up in April

## 2017-02-20 ENCOUNTER — Encounter (INDEPENDENT_AMBULATORY_CARE_PROVIDER_SITE_OTHER): Payer: Self-pay

## 2017-03-09 ENCOUNTER — Other Ambulatory Visit: Payer: Self-pay

## 2017-03-09 DIAGNOSIS — F0150 Vascular dementia without behavioral disturbance: Secondary | ICD-10-CM

## 2017-03-09 DIAGNOSIS — F05 Delirium due to known physiological condition: Secondary | ICD-10-CM

## 2017-03-09 DIAGNOSIS — R2689 Other abnormalities of gait and mobility: Secondary | ICD-10-CM

## 2017-03-09 DIAGNOSIS — R42 Dizziness and giddiness: Secondary | ICD-10-CM

## 2017-03-10 ENCOUNTER — Ambulatory Visit: Payer: BLUE CROSS/BLUE SHIELD

## 2017-03-10 DIAGNOSIS — R410 Disorientation, unspecified: Secondary | ICD-10-CM | POA: Insufficient documentation

## 2017-03-10 DIAGNOSIS — F0150 Vascular dementia without behavioral disturbance: Secondary | ICD-10-CM

## 2017-03-10 DIAGNOSIS — F05 Delirium due to known physiological condition: Secondary | ICD-10-CM

## 2017-03-10 DIAGNOSIS — Z8673 Personal history of transient ischemic attack (TIA), and cerebral infarction without residual deficits: Secondary | ICD-10-CM | POA: Insufficient documentation

## 2017-03-10 DIAGNOSIS — R42 Dizziness and giddiness: Secondary | ICD-10-CM | POA: Insufficient documentation

## 2017-03-10 DIAGNOSIS — R2689 Other abnormalities of gait and mobility: Secondary | ICD-10-CM

## 2017-03-10 DIAGNOSIS — I668 Occlusion and stenosis of other cerebral arteries: Secondary | ICD-10-CM | POA: Insufficient documentation

## 2017-03-23 ENCOUNTER — Ambulatory Visit
Admission: RE | Admit: 2017-03-23 | Discharge: 2017-03-23 | Disposition: A | Discharge status: Home / Self Care | Payer: BLUE CROSS/BLUE SHIELD | Source: Ambulatory Visit

## 2017-03-23 DIAGNOSIS — R2689 Other abnormalities of gait and mobility: Secondary | ICD-10-CM

## 2017-03-23 DIAGNOSIS — Z0389 Encounter for observation for other suspected diseases and conditions ruled out: Secondary | ICD-10-CM | POA: Insufficient documentation

## 2017-03-23 DIAGNOSIS — R410 Disorientation, unspecified: Secondary | ICD-10-CM

## 2017-03-23 NOTE — Procedures (Signed)
Indication / History: The patient has h/o gait abnormality, disorientation    This electroencephalogram was recorded using both referential and differential montages. Using a digital machine the 18 channel International 10-20 System of electrode placement was used.      Background: The EEG was recorded with the patient awake, drowsy and asleep. The waking background activity in the occipital lobe consists of fairly well developed medium voltage 10-11 hz. activity that attenuates with eye opening. Some eye movement artifact and low voltage fast activity is seen frontally.   Drowsiness heralded by disorganization and slowing of the background. Stage II sleep was obtained with symmetric spindle activity and vertex waves.     Abnormal activity : No epileptiform discharges, focal slowing, spike waves or clinical events noted.  Hemispheric asymmetry : none    Photic stimulation and hyperventilation produced no abnormality.    IMPRESSION: Normal study: This routine EEG recorded during wakefulness and drowsiness was within normal limits for age. There is no evidence of epileptiform activity or slowing.    Rosalio Macadamia, MD - [facility]  Available on XTEND paging  Board Certified in Neurology by ABPN  Board Certified Clinical Neurophysiology by [employer]BPN

## 2017-03-23 NOTE — Progress Notes (Signed)
EEG COMPLETED. REPORT TO FOLLOW.

## 2017-10-09 ENCOUNTER — Encounter (INDEPENDENT_AMBULATORY_CARE_PROVIDER_SITE_OTHER): Payer: Self-pay | Admitting: Cardiovascular Disease

## 2017-10-10 ENCOUNTER — Telehealth (INDEPENDENT_AMBULATORY_CARE_PROVIDER_SITE_OTHER): Payer: Self-pay

## 2017-10-10 ENCOUNTER — Encounter (INDEPENDENT_AMBULATORY_CARE_PROVIDER_SITE_OTHER): Payer: Self-pay

## 2017-10-10 NOTE — Telephone Encounter (Signed)
Error

## 2017-11-14 ENCOUNTER — Ambulatory Visit (INDEPENDENT_AMBULATORY_CARE_PROVIDER_SITE_OTHER): Payer: BLUE CROSS/BLUE SHIELD

## 2017-11-14 ENCOUNTER — Ambulatory Visit
Admission: RE | Admit: 2017-11-14 | Discharge: 2017-11-14 | Disposition: A | Discharge status: Home / Self Care | Payer: BLUE CROSS/BLUE SHIELD | Source: Ambulatory Visit | Attending: Cardiovascular Disease | Admitting: Cardiovascular Disease

## 2017-11-14 DIAGNOSIS — I712 Thoracic aortic aneurysm, without rupture, unspecified: Secondary | ICD-10-CM

## 2017-11-14 DIAGNOSIS — I517 Cardiomegaly: Secondary | ICD-10-CM | POA: Insufficient documentation

## 2017-11-14 DIAGNOSIS — I77810 Thoracic aortic ectasia: Secondary | ICD-10-CM | POA: Insufficient documentation

## 2017-11-14 DIAGNOSIS — I493 Ventricular premature depolarization: Secondary | ICD-10-CM

## 2017-11-14 DIAGNOSIS — I081 Rheumatic disorders of both mitral and tricuspid valves: Secondary | ICD-10-CM | POA: Insufficient documentation

## 2017-11-14 NOTE — Procedures (Signed)
24 hour holter monitor placed, instructions given, understood by pt

## 2017-11-21 ENCOUNTER — Encounter (INDEPENDENT_AMBULATORY_CARE_PROVIDER_SITE_OTHER): Payer: Self-pay | Admitting: Cardiovascular Disease

## 2017-11-30 ENCOUNTER — Encounter (INDEPENDENT_AMBULATORY_CARE_PROVIDER_SITE_OTHER): Payer: BLUE CROSS/BLUE SHIELD | Admitting: Cardiovascular Disease

## 2017-12-01 DIAGNOSIS — I493 Ventricular premature depolarization: Secondary | ICD-10-CM

## 2017-12-14 ENCOUNTER — Encounter (INDEPENDENT_AMBULATORY_CARE_PROVIDER_SITE_OTHER): Payer: Self-pay | Admitting: Cardiovascular Disease

## 2017-12-14 ENCOUNTER — Ambulatory Visit (INDEPENDENT_AMBULATORY_CARE_PROVIDER_SITE_OTHER): Payer: BLUE CROSS/BLUE SHIELD | Admitting: Cardiovascular Disease

## 2017-12-14 VITALS — BP 124/66 | HR 60 | Ht 74.0 in | Wt 215.0 lb

## 2017-12-14 DIAGNOSIS — I712 Thoracic aortic aneurysm, without rupture, unspecified: Secondary | ICD-10-CM

## 2017-12-14 DIAGNOSIS — E7800 Pure hypercholesterolemia, unspecified: Secondary | ICD-10-CM

## 2017-12-14 DIAGNOSIS — I10 Essential (primary) hypertension: Secondary | ICD-10-CM

## 2017-12-14 NOTE — Progress Notes (Signed)
[facility] CONSULTATION    I had the pleasure of seeing [name] today for cardiovascular evaluation. He is a pleasant 67 y.o. male with a history of labile hypertension, type 2 diabetes, hyperlipidemia, gout, and a history of a right bundle branch block.  I saw him in consultation in 2015, at which time he had an EKG which showed a right bundle branch block.  A nuclear stress test was unremarkable.  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  I last saw him in April 2018, at which time he was having episodes where he did feel off balance.  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  A 48-hour monitor and echocardiogram were unremarkable as to the cause.  I last saw him in June.  He presents today for a routine visit and follow-up of a repeat echocardiogram and Holter monitor.    He tells me that his year has been fine.  He did see a neurologist in [place] who felt that blood pressure fluctuations between his brain and his heart with a cause of his symptoms.  Not explained, however was why it was only happening when he walked into certain stores and not at other times.  No intervention was recommended.  He still has episodes but just tries to make his visits they are very brief.  He denies he is ever had any syncope.  The episodes of the last 5 or 10 minutes but he typically tries to stay calm and wait for the past.    After the holidays, he noted that his blood pressures were running high.  In January, he thought his PCP who noted blood pressures in the 150s with sugars that were also out of control.  His weight was also.  His Janumet was increased and amlodipine was added to his regimen.  Since then, his blood pressures have been well controlled.  He also feels more calm than he had previously.  He has lost about 10 pounds since he started on the Janumet.  He denies  that he is having any episodes of lightheadedness or dizziness on the medications or that his blood pressures have been too low.  He has been regularly checking the blood pressures and has been getting systolics anywhere between 101 and 124 bpm.  Diastolic blood pressures are well controlled.    He exercises 3 days a week on a treadmill for 30 minutes.  He also lifts weights for 30 minutes.  He also walks the dog for up to an hour.  He denies any symptoms or limitations.    MEDICATIONS:   Current Outpatient Prescriptions   Medication Sig Dispense Refill   . allopurinol (ZYLOPRIM) 300 MG tablet Take 300 mg by mouth daily.     Marland Kitchen amLODIPine (NORVASC) 5 MG tablet Take 5 mg by mouth daily     . aspirin EC 81 MG EC tablet Take 81 mg by mouth daily.     Marland Kitchen atorvastatin (LIPITOR) 20 MG tablet Take 1 tablet (20 mg total) by mouth daily. 90 tablet 3   . Cetirizine HCl (KLS ALLER-TEC PO) Take by mouth     . GLIPIZIDE XL 5 MG 24 hr tablet Take 10 mg by mouth 2 (two) times daily.      1   . hydrochlorothiazide (HYDRODIURIL) 25 MG tablet Take 25 mg by mouth daily.  0   . lisinopril (PRINIVIL,ZESTRIL) 40 MG tablet Take 40 mg by mouth daily.     . metoprolol (TOPROL-XL) 100 MG 24 hr tablet Take 100 mg by mouth 2 (two) times daily.         Marland Kitchen SITagliptin-metFORMIN (JANUMET) 50-1000 MG tablet Take 1 tablet by mouth 2 (two) times daily with meals       No current facility-administered medications for this visit.      REVIEW OF SYSTEMS: All other systems reviewed and negative except as stated above.     PHYSICAL EXAMINATION  General Appearance:  A well-appearing male in no acute distress.    Vital Signs: BP 124/66   Pulse 60   Ht 1.88 m (6\' 2" )   Wt 97.5 kg (215 lb)   SpO2 98%   BMI 27.60 kg/m    HEENT: Sclera anicteric, conjunctiva without pallor, moist mucous membranes, normal dentition. No arcus.    Neck: Supple without jugular venous distention. Thyroid nonpalpable. Normal carotid upstrokes without bruits.    Chest: Clear to  auscultation bilaterally with good air movement and respiratory effort and no wheezes, rales, or rhonchi    Cardiovascular: RRR with occasional ectopic beats, sometimes in a pattern of bigeminy, normal S1 and physiologically split S2 without murmurs, gallops or rub. PMI diffuse.    Abdomen: Soft, nontender, nondistended, with normoactive bowel sounds. No organomegaly.  No pulsatile masses, or bruits.    Extremities: Warm without edema, clubbing, or cyanosis. All peripheral pulses are full and equal.    Skin: No rash, xanthoma or xanthelasma.    Neuro: Alert and oriented x3. Grossly intact. Strength is symmetrical. Normal mood and affect.     24 HOUR HOLTER: 11/14/2017-11/15/2017:  1- Baseline SR with 1st degree AV block and BBB. Average heart rate was 65 bpm varying from 51 to 90 bpm.   2- Rare PACs (<150) with 8 couplets,one triplet, and two runs of PAT : a 4 beat run at 122 bpm and an 8 beat run at 114 bpm  3- Very rare bifocal PVCs (total of 19 beats(<0.1%) with 4 couplets  4- No Afib,flutter,pauses or evidence of advanced AV block  5- Total 5 patient events "palpitations" and "lightheadedness" had no EKG correlations    ECHO: 11/14/2017:  1. Mild left ventricular dilation.  2. Normal left ventricular systolic function. Estimated ejection fraction 64.8%.  3. 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 Mild ascending aorta dilation. 3.9 cm  5. Mild mitral regurgitation.  6. Mild tricuspid regurgitation.  7. Normal estimated pulmonary artery pressure.  8. Compared to the prior study of 11/14/16, the aortic root diameter has increased from 4.2 to 4.5 cm    Labs:   09/21/2017: Glucose 101, BUN 24, Creatinine 0.96, sodium 141, potassium 4.5,AST 23,ALT 27, HbA1c 7.4    06/08/2017: Total cholesterol 109, triglycerides 97, HDL 47, LDL 44,TSH 3.74    IMPRESSION/RECOMMENDATIONS: Mr. Schappert is a 67 y.o. male with CAD risk factors including age, hypertension, hyperlipidemia, and diabetes who presents for a follow-up.  His blood  pressures are well controlled on his current regimen.  His echocardiogram shows that the aortic root is at 4.5 cm.  Though it looks smaller than that last year, it was similarly noted in 2015 by a CT angiogram.  Overall, it does not appear to be significantly changed continue to follow him with serial echocardiograms.  I have asked him to repeat an echocardiogram in 1 year.    I did have him  repeat a Holter monitor to quantify the degree of his ventricular ectopy.  As noted above, the degree of ectopy significantly decreased from 12% down to less than 0.1%.  It is unclear why he had the increase.  I do not anticipate that amlodipine had anything to do with the decrease in ectopy as well.  At this point, I have offered him reassurance.  Again, I do not think any arrhythmias correlated with his symptoms.    I would ask him to continue to work on his diabetes control.  His lipids are optimal on statin.  He will continue exercise.  He will complete an echocardiogram in 1 year followed by an office visit.    PLAN:  1.  Continue current medication regimen.  2.  Continue exercise and aerobic activity  3.  Echocardiogram followed by office visit in 1 year

## 2017-12-14 NOTE — Patient Instructions (Addendum)
Continue to exercise regularly          Follow up in 1 year. Echo prior to office visit.

## 2018-01-03 ENCOUNTER — Other Ambulatory Visit (INDEPENDENT_AMBULATORY_CARE_PROVIDER_SITE_OTHER): Payer: Self-pay

## 2018-01-03 DIAGNOSIS — E785 Hyperlipidemia, unspecified: Secondary | ICD-10-CM

## 2018-01-03 DIAGNOSIS — I10 Essential (primary) hypertension: Secondary | ICD-10-CM

## 2018-01-03 MED ORDER — ATORVASTATIN CALCIUM 20 MG PO TABS
20.0000 mg | ORAL_TABLET | Freq: Every day | ORAL | 3 refills | Status: DC
Start: 2018-01-03 — End: 2019-03-15

## 2018-09-27 ENCOUNTER — Encounter (INDEPENDENT_AMBULATORY_CARE_PROVIDER_SITE_OTHER): Payer: Self-pay | Admitting: Cardiovascular Disease

## 2018-10-02 ENCOUNTER — Other Ambulatory Visit (INDEPENDENT_AMBULATORY_CARE_PROVIDER_SITE_OTHER): Payer: Self-pay

## 2018-10-02 DIAGNOSIS — E785 Hyperlipidemia, unspecified: Secondary | ICD-10-CM

## 2018-10-02 DIAGNOSIS — Z79899 Other long term (current) drug therapy: Secondary | ICD-10-CM

## 2018-10-09 ENCOUNTER — Encounter (INDEPENDENT_AMBULATORY_CARE_PROVIDER_SITE_OTHER): Payer: Self-pay

## 2018-12-13 ENCOUNTER — Ambulatory Visit
Admission: RE | Admit: 2018-12-13 | Discharge: 2018-12-13 | Disposition: A | Discharge status: Home / Self Care | Payer: BLUE CROSS/BLUE SHIELD | Source: Ambulatory Visit | Attending: Cardiovascular Disease | Admitting: Cardiovascular Disease

## 2018-12-13 DIAGNOSIS — I10 Essential (primary) hypertension: Secondary | ICD-10-CM | POA: Insufficient documentation

## 2018-12-13 DIAGNOSIS — I712 Thoracic aortic aneurysm, without rupture, unspecified: Secondary | ICD-10-CM

## 2018-12-18 ENCOUNTER — Encounter (INDEPENDENT_AMBULATORY_CARE_PROVIDER_SITE_OTHER): Payer: Self-pay

## 2018-12-18 NOTE — Progress Notes (Signed)
CONSENT: Verbal consent has been obtained from the patient to conduct a telephone visit encounter to minimize exposure to COVID-19.  He was unable to set up his video appropriately for a video visit.    The time spent in medical discussion during this visit was 20 minutes.    HPI: [name] was seen today via telephone visit in cardiovascular follow up.  He is a 68 y.o. male with a history of labile hypertension, type 2 diabetes, hyperlipidemia, gout, and a history of a right bundle branch block.  A nuclear stress test in 2015 was normal.  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  For episodes of imbalance and dizziness, he completed a monitor and a repeat echocardiogram in 2018 which were unchanged.  I last saw him 1 year ago at which time his echocardiogram showed the aorta had slightly increased in size.  I asked him to complete an echocardiogram this year followed by an office visit.    He tells me that he has been doing well though is getting tired of being on quarantine but many others.  Over the past year, when he is gone to doctors offices, his blood pressures have been in the range of the 120s over 70s.  Sometimes it will be as low as 111/67.  He reports feeling well and denies any episodes of lightheadedness or dizziness.  He has been going to the [employer] last but has noted a few occurrences of the feeling like he needs to get away or fall.  He denies that he has syncope or seizure activities.  He also had an episode in [place] on a bright sunny day on some white clips.  He thinks it is more of a visual cue than anything else.    He continues to walk on the treadmill or walk his dog for an hour.  He denies any symptoms or limitations.  He has noticed that since August he has had some lower extremity edema which tends to come and go but is more prevalent at the end of the day.  It is intermittent in occurrence.  He denies  orthopnea or paroxysmal nocturnal dyspnea.    MEDICATIONS:     allopurinol (ZYLOPRIM) 300 MG tablet, Take 300 mg by mouth daily.    amLODIPine (NORVASC) 5 MG tablet, Take 5 mg by mouth daily    aspirin EC 81 MG EC tablet, Take 81 mg by mouth daily.    atorvastatin (LIPITOR) 20 MG tablet, Take 1 tablet (20 mg total) by mouth daily    Cetirizine HCl (KLS ALLER-TEC PO), Take by mouth    glipiZIDE (GLUCOTROL) 10 MG tablet, Take 10 mg by mouth 2 (two) times daily before meals    hydrochlorothiazide (HYDRODIURIL) 25 MG tablet, Take 25 mg by mouth daily.      lisinopril (PRINIVIL,ZESTRIL) 40 MG tablet, Take 40 mg by mouth daily.    metoprolol (TOPROL-XL) 100 MG 24 hr tablet, Take 100 mg by mouth 2 (two) times daily.      SITagliptin-metFORMIN (JANUMET) 50-1000 MG tablet, Take 1 tablet by mouth 2 (two) times daily with meals    glipiZIDE XL (GLIPIZIDE XL) 5 MG 24 hr tablet, Take 10 mg by mouth 2 (two) times daily      REVIEW OF SYSTEMS: All other systems reviewed and negative except as above.     PHYSICAL EXAMINATION  Vital Signs: BP 123/81    Pulse 65  Ht 1.905 m (6\' 3" )    Wt 99.8 kg (220 lb)    BMI 27.50 kg/m    The following limited physical exam components were observed via video:  General Appearance:  Alert, cooperative, no distress, appears stated age   Head: Normocephalic, without obvious abnormality, atraumatic   Eyes:  Sclera anicteric, conjunctiva without pallor   Lungs:   Breathing comfortably without accessory muscle use. Normal respiratory effort.    Musculoskeletal: No apparent gait or station abnormalities    Skin: Normal skin color, texture, no visible rashes or lesions   Extremities: No apparent edema   Neurologic: Alert and oriented x3, normal mood and affect. Grossly intact.     LABS: 10/03/2018: Total cholesterol 95 triglyceride 82 HDL 30 LDL 49 glucose 97 BUN 17 creatinine 0.95 sodium 141 potassium 4.5 normal LFTs hemoglobin A1c 6.6%    ECHO: 12/13/2018:  1. Mild left ventricular  dilation.  2. Mild concentric left ventricular hypertrophy.  3. LV ejection fraction is calculated to be 63.9 % by the Simpson biplane method .  4. 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 Mild ascending aorta dilation. 3.7 cm  6. Mild mitral regurgitation.  7. Mild tricuspid regurgitation.  8. Pulmonary arterial systolic pressure is normal  9. Compared to the prior study of 11/14/17, there has been no significant change.    IMPRESSION/RECOMMENDATIONS: Noah Wells is a 68 y.o. male who presents for follow-up.  Clinically, he appears to be doing well.  He is able to exercise without significant symptoms or limitations.  His blood pressures are in the desired range as is his cholesterol which is actually on the low side.  We reviewed the results of his echocardiogram which is essentially unchanged from previous.  There is no further dilation of his aorta and no further progression of his valvular disease.  At this point, he will continue his current medication regimen.  I have asked him to continue exercise and aerobic activity.  He will follow-up with an echocardiogram and office visit in 1 year.    PLAN:  1.  Continue current medication regimen  2.  Continue exercise and aerobic activity  3.  Echocardiogram followed by office visit in 1 year

## 2018-12-20 ENCOUNTER — Telehealth (INDEPENDENT_AMBULATORY_CARE_PROVIDER_SITE_OTHER): Payer: BLUE CROSS/BLUE SHIELD | Admitting: Cardiovascular Disease

## 2018-12-20 ENCOUNTER — Encounter (INDEPENDENT_AMBULATORY_CARE_PROVIDER_SITE_OTHER): Payer: Self-pay | Admitting: Cardiovascular Disease

## 2018-12-20 VITALS — BP 123/81 | HR 65 | Ht 75.0 in | Wt 220.0 lb

## 2018-12-20 DIAGNOSIS — I712 Thoracic aortic aneurysm, without rupture, unspecified: Secondary | ICD-10-CM

## 2018-12-20 DIAGNOSIS — I10 Essential (primary) hypertension: Secondary | ICD-10-CM

## 2018-12-20 NOTE — Patient Instructions (Signed)
1.  Continue current medication regimen  2.  Continue exercise and aerobic activity  3.  Echocardiogram followed by office visit in 1 year

## 2019-03-15 ENCOUNTER — Other Ambulatory Visit (INDEPENDENT_AMBULATORY_CARE_PROVIDER_SITE_OTHER): Payer: Self-pay

## 2019-03-15 DIAGNOSIS — I10 Essential (primary) hypertension: Secondary | ICD-10-CM

## 2019-03-15 DIAGNOSIS — E785 Hyperlipidemia, unspecified: Secondary | ICD-10-CM

## 2019-03-15 MED ORDER — ATORVASTATIN CALCIUM 20 MG PO TABS
20.0000 mg | ORAL_TABLET | Freq: Every day | ORAL | 3 refills | Status: DC
Start: 2019-03-15 — End: 2020-04-02

## 2019-03-15 NOTE — Telephone Encounter (Signed)
Reviewed-TM

## 2019-03-25 ENCOUNTER — Ambulatory Visit (INDEPENDENT_AMBULATORY_CARE_PROVIDER_SITE_OTHER): Payer: Self-pay | Admitting: Family Medicine

## 2019-09-12 ENCOUNTER — Encounter (INDEPENDENT_AMBULATORY_CARE_PROVIDER_SITE_OTHER): Payer: Self-pay

## 2019-09-12 LAB — LIPID PANEL
Cholesterol / HDL Ratio: 2.7 (calc) (ref <5.0)
Cholesterol: 96 mg/dL (ref <200)
HDL: 35 mg/dL — ABNORMAL LOW (ref >=40)
LDL Calculated: 44 mg/dL (calc)
NON HDL CHOLESTEROL: 61 mg/dL (calc) (ref <130)
Triglycerides: 89 mg/dL (ref <150)

## 2019-09-13 ENCOUNTER — Encounter (INDEPENDENT_AMBULATORY_CARE_PROVIDER_SITE_OTHER): Payer: Self-pay

## 2019-10-26 ENCOUNTER — Encounter (INDEPENDENT_AMBULATORY_CARE_PROVIDER_SITE_OTHER): Payer: Self-pay

## 2019-10-27 ENCOUNTER — Ambulatory Visit (INDEPENDENT_AMBULATORY_CARE_PROVIDER_SITE_OTHER): Payer: BLUE CROSS/BLUE SHIELD

## 2019-10-27 DIAGNOSIS — Z23 Encounter for immunization: Secondary | ICD-10-CM

## 2019-11-17 ENCOUNTER — Encounter (INDEPENDENT_AMBULATORY_CARE_PROVIDER_SITE_OTHER): Payer: Self-pay

## 2019-11-23 ENCOUNTER — Ambulatory Visit (INDEPENDENT_AMBULATORY_CARE_PROVIDER_SITE_OTHER): Payer: Medicare Other

## 2019-11-23 DIAGNOSIS — Z23 Encounter for immunization: Secondary | ICD-10-CM

## 2019-12-13 ENCOUNTER — Ambulatory Visit
Admission: RE | Admit: 2019-12-13 | Discharge: 2019-12-13 | Disposition: A | Discharge status: Home / Self Care | Payer: Medicare Other | Source: Ambulatory Visit | Attending: Cardiovascular Disease | Admitting: Cardiovascular Disease

## 2019-12-13 DIAGNOSIS — I712 Thoracic aortic aneurysm, without rupture, unspecified: Secondary | ICD-10-CM

## 2019-12-13 DIAGNOSIS — I10 Essential (primary) hypertension: Secondary | ICD-10-CM

## 2019-12-18 ENCOUNTER — Ambulatory Visit
Admission: RE | Admit: 2019-12-18 | Discharge: 2019-12-18 | Disposition: A | Discharge status: Home / Self Care | Payer: Medicare Other | Source: Ambulatory Visit | Attending: Cardiovascular Disease | Admitting: Cardiovascular Disease

## 2019-12-18 DIAGNOSIS — I712 Thoracic aortic aneurysm, without rupture: Secondary | ICD-10-CM | POA: Insufficient documentation

## 2019-12-18 DIAGNOSIS — I10 Essential (primary) hypertension: Secondary | ICD-10-CM

## 2019-12-20 LAB — ECHOCARDIOGRAM ADULT COMPLETE W CLR/ DOPP WAVEFORM
AV Area (Cont Eq VTI): 3.29
AV Area (Cont Eq VTI): 3.297
AV Mean Gradient: 3
AV Peak Velocity: 120
Ao Root Diameter (2D): 4.6
Ao Root Diameter (2D): 4.7
BP Mod LV Ejection Fraction: 60.8
IVS Diastolic Thickness (2D): 1.02
LA Dimension (2D): 3.7
LA Dimension (2D): 3.7
LA Volume Index (BP A-L): 0.018
LVID diastole (2D): 5.38
LVID systole (2D): 3.76
MV Area (PHT): 3.122
MV E/A: 0.956
MV E/A: 1
MV E/e' (Average): 7.637
Prox Ascending Aorta Diameter: 3.7
Pulmonary Valve Findings: NORMAL
RV Basal Diastolic Dimension: 3.38
RV Systolic Pressure: 18
RV Systolic Pressure: 21.318
TAPSE: 2.09
Tricuspid Valve Findings: NORMAL

## 2019-12-25 ENCOUNTER — Encounter (INDEPENDENT_AMBULATORY_CARE_PROVIDER_SITE_OTHER): Payer: Self-pay

## 2019-12-25 NOTE — Progress Notes (Signed)
[facility] [place] OFFICE VISIT    I had the pleasure of seeing [name] today for cardiovascular follow up. He is a pleasant 69 y.o. male with a history of labile hypertension, type 2 diabetes, hyperlipidemia, gout, and a history of a right bundle branch block.  A nuclear stress test in 2015 was normal.  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  For episodes of imbalance and dizziness, he completed a monitor and a repeat echocardiogram in 2018 which were unchanged.  I last saw him on a video visit in May, at which time he was doing well.  I asked him to repeat an echocardiogram and follow-up now for an office visit.    He tells me that overall things are about the same and he has been doing well.  He does acknowledge that he was doing less walking and exercise than he had been previously.  As such, he gained about 12 pounds.  Now that he has gotten the vaccine, he is starting back to the gym.  His first day was yesterday.  He was tired but denied that he was having any significant symptoms.    In August 2020, he noted that he was having ankle edema at the end of the day after sitting at his desk.  It was not consistent, however.  He denied shortness of breath or orthopnea.  It seems to have resolved but intermittently does return.    At the dentist office, he noted that his blood pressure was elevated at 140 mmHg systolic.  At home, however he sees blood pressures typically in the 100-1 36 range with an average blood pressure of 120 mmHg or less.  The blood pressures are only in the 130s early in the morning before he has taken his medications.  He denies any lightheadedness, dizziness, or headaches.    His most recent hemoglobin A1c was 7.2% which is up compared to last year when it had gotten down to 6.4%.  He is trying to eat fish at least 4 days a week and has a burger for a treat on Saturdays.  He denies any chest discomfort  or shortness of breath symptoms.    MEDICATIONS:   Current Outpatient Medications   Medication Sig    allopurinol (ZYLOPRIM) 300 MG tablet Take 300 mg by mouth daily.    amLODIPine (NORVASC) 5 MG tablet Take 5 mg by mouth daily    aspirin EC 81 MG EC tablet Take 81 mg by mouth daily.    atorvastatin (Lipitor) 20 MG tablet Take 1 tablet (20 mg total) by mouth daily    Cetirizine HCl (KLS ALLER-TEC PO) Take 10 mg by mouth 2 (two) times daily as needed       glipiZIDE (GLUCOTROL) 10 MG tablet Take 10 mg by mouth 2 (two) times daily before meals    hydrochlorothiazide (HYDRODIURIL) 25 MG tablet Take 25 mg by mouth daily.       lisinopril (PRINIVIL,ZESTRIL) 40 MG tablet Take 40 mg by mouth daily.    metoprolol (TOPROL-XL) 100 MG 24 hr tablet Take 100 mg by mouth 2 (two) times daily.        Multiple Vitamins-Minerals (ZINC PO) Take 500 mg by mouth daily    SITagliptin-metFORMIN (JANUMET) 50-1000 MG tablet Take 1 tablet by mouth 2 (two) times daily with meals    Vitamin D3 (CHOLECALCIFEROL) 125 MCG (5000 UT) Cap capsule Take 125 mcg by mouth  daily      PHYSICAL EXAMINATION  Health Related Quality of Life:     Vital Signs: BP 120/82 (BP Site: Left arm, Patient Position: Sitting, Cuff Size: Large)    Pulse 63    Ht 1.905 m (6\' 3" )    Wt 103 kg (227 lb)    SpO2 96%    BMI 28.37 kg/m    Chest: Clear to auscultation bilaterally  Cardiovascular: No murmurs or gallops.   Abdomen: Soft, nontender. No pulsatile masses or bruits.    Extremities: Warm without edema. Peripheral pulses are full and equal.    EKG: Normal sinus rhythm at 68 bpm with frequent isolated unifocal PVCs, left axis deviation, right bundle branch block.  No significant change from 11/24/2016.    LABS:   Lab Results   Component Value Date    CHOL 96 09/11/2019    TRIG 89 09/11/2019    HDL 35 (L) 09/11/2019    LDL 44 09/11/2019     ECHOCARDIOGRAM 12/20/19:  Summary    * Left ventricular wall thickness is mildly increased.    * Left ventricular systolic  function is normal with an ejection fraction by  Biplane Method of Discs of 61 %.    * Normal right ventricular systolic function.    * The right atrium is mildly dilated.    * There is mild mitral regurgitation.    * There is mild tricuspid regurgitation.    * No pulmonary hypertension with estimated right ventricular systolicpressure of  21 mmHg.    * The ascending aorta is mildly dilated. 3.7 cm.    * The aortic root is moderately dilated. 4.6 cm.    * Compared to the prior study dated 12/13/18, the left ventricular chamber  size has decreased. The aortic diameters are similar to previous.    IMPRESSION/RECOMMENDATIONS: Mr. Donaway is a 69 y.o. male who presents for follow up.  We reviewed the results of his echocardiogram which are essentially unchanged from previous.  The aortic root continues to be moderately dilated.  His blood pressures at home appear to be well controlled with an average blood pressure of less than 120/80 and down in the low 100s as well.  The highest it gets is in the low 130s and that is before he has taken his medications.  I do not think any further changes are necessary at this time.    As noted above, his cholesterol is quite low.  We discussed decreasing his dose but he is doing well with it and tolerating it.  He feels that he would like to liberalize his diet a little bit more.  In the interim, I have encouraged him to continue with aerobic and cardio exercise and work on losing the weight he had that he had gained.  He will repeat fasting laboratory studies by the end of the year.  I will otherwise see him in 1 year.    PLAN:  1.    Continue current medication regimen  2.  Continue increased cardio and aerobic exercise, mild weight loss  3.  Fasting laboratory studies at the end of the year  4.  Office visit in 1 year

## 2019-12-26 ENCOUNTER — Encounter (INDEPENDENT_AMBULATORY_CARE_PROVIDER_SITE_OTHER): Payer: Self-pay | Admitting: Cardiovascular Disease

## 2019-12-26 ENCOUNTER — Ambulatory Visit (INDEPENDENT_AMBULATORY_CARE_PROVIDER_SITE_OTHER): Payer: Medicare Other | Admitting: Cardiovascular Disease

## 2019-12-26 VITALS — BP 120/82 | HR 63 | Ht 75.0 in | Wt 227.0 lb

## 2019-12-26 DIAGNOSIS — E785 Hyperlipidemia, unspecified: Secondary | ICD-10-CM

## 2019-12-26 DIAGNOSIS — I10 Essential (primary) hypertension: Secondary | ICD-10-CM

## 2019-12-26 DIAGNOSIS — I712 Thoracic aortic aneurysm, without rupture, unspecified: Secondary | ICD-10-CM

## 2019-12-26 DIAGNOSIS — Z79899 Other long term (current) drug therapy: Secondary | ICD-10-CM

## 2019-12-26 LAB — ECG 12-LEAD
Atrial Rate: 267 {beats}/min
Atrial Rate: 68 {beats}/min
P Axis: 63 degrees
P-R Interval: 172 ms
Q-T Interval: 426 ms
Q-T Interval: 436 ms
QRS Duration: 134 ms
QRS Duration: 82 ms
QTC Calculation (Bezet): 424 ms
QTC Calculation (Bezet): 452 ms
R Axis: -60 degrees
R Axis: 96 degrees
T Axis: 55 degrees
T Axis: 81 degrees
Ventricular Rate: 57 {beats}/min
Ventricular Rate: 68 {beats}/min

## 2019-12-26 NOTE — Patient Instructions (Addendum)
Continue current medication regimen    Continue to cardio exercise, mild weight loss    Fasting labs end of year

## 2020-01-22 ENCOUNTER — Encounter (INDEPENDENT_AMBULATORY_CARE_PROVIDER_SITE_OTHER): Payer: Self-pay | Admitting: Family Medicine

## 2020-04-02 ENCOUNTER — Other Ambulatory Visit (INDEPENDENT_AMBULATORY_CARE_PROVIDER_SITE_OTHER): Payer: Self-pay

## 2020-04-02 DIAGNOSIS — E785 Hyperlipidemia, unspecified: Secondary | ICD-10-CM

## 2020-04-02 DIAGNOSIS — I10 Essential (primary) hypertension: Secondary | ICD-10-CM

## 2020-04-02 MED ORDER — ATORVASTATIN CALCIUM 20 MG PO TABS
20.0000 mg | ORAL_TABLET | Freq: Every day | ORAL | 3 refills | Status: DC
Start: 2020-04-02 — End: 2021-05-03

## 2020-04-02 NOTE — Telephone Encounter (Signed)
Reviewed -HT

## 2020-05-15 ENCOUNTER — Encounter (INDEPENDENT_AMBULATORY_CARE_PROVIDER_SITE_OTHER): Payer: Self-pay | Admitting: Cardiovascular Disease

## 2020-10-22 ENCOUNTER — Encounter (INDEPENDENT_AMBULATORY_CARE_PROVIDER_SITE_OTHER): Payer: Self-pay | Admitting: Cardiovascular Disease

## 2020-12-07 ENCOUNTER — Ambulatory Visit
Admission: RE | Admit: 2020-12-07 | Discharge: 2020-12-07 | Disposition: A | Discharge status: Home / Self Care | Payer: Medicare Other | Source: Ambulatory Visit | Attending: Cardiovascular Disease | Admitting: Cardiovascular Disease

## 2020-12-07 DIAGNOSIS — I712 Thoracic aortic aneurysm, without rupture, unspecified: Secondary | ICD-10-CM

## 2020-12-07 DIAGNOSIS — Z79899 Other long term (current) drug therapy: Secondary | ICD-10-CM | POA: Insufficient documentation

## 2020-12-07 DIAGNOSIS — E785 Hyperlipidemia, unspecified: Secondary | ICD-10-CM | POA: Insufficient documentation

## 2020-12-07 DIAGNOSIS — I10 Essential (primary) hypertension: Secondary | ICD-10-CM | POA: Insufficient documentation

## 2020-12-09 ENCOUNTER — Encounter (INDEPENDENT_AMBULATORY_CARE_PROVIDER_SITE_OTHER): Payer: Self-pay | Admitting: Cardiovascular Disease

## 2020-12-11 LAB — ECHOCARDIOGRAM ADULT COMPLETE W CLR/ DOPP WAVEFORM
AV Area (Cont Eq VTI): 2.51
AV Area (Cont Eq VTI): 2.514
AV Mean Gradient: 4
AV Peak Velocity: 131
Ao Root Diameter (2D): 4.6
BP Mod LV Ejection Fraction: 60.6
IVS Diastolic Thickness (2D): 1.05
LA Dimension (2D): 3.6
LA Volume Index (BP A-L): 0.023
LVID diastole (2D): 5.69
LVID systole (2D): 2.26
MV Area (PHT): 3.647
MV E/A: 0.8
MV E/A: 0.806
MV E/e' (Average): 9.273
Mitral Valve Findings: NORMAL
Prox Ascending Aorta Diameter: 3.8
Pulmonary Valve Findings: NORMAL
RV Basal Diastolic Dimension: 3.94
RV Function: NORMAL
RV Systolic Pressure: 22.714
Site RA Size (AS): NORMAL
Site RV Size (AS): NORMAL
TAPSE: 2.63
Tricuspid Valve Findings: NORMAL

## 2021-01-07 ENCOUNTER — Ambulatory Visit (INDEPENDENT_AMBULATORY_CARE_PROVIDER_SITE_OTHER): Payer: Medicare Other | Admitting: Cardiovascular Disease

## 2021-02-01 NOTE — Progress Notes (Unsigned)
[facility] [place] OFFICE VISIT    I had the pleasure of seeing Mr. Lehner today for cardiovascular follow up. He is a pleasant 70 y.o. male with a history of labile hypertension, type 2 diabetes, hyperlipidemia, gout, and a history of a right bundle branch block.  A nuclear stress test in 2015 was normal.  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  For episodes of imbalance and dizziness, he completed a monitor and a repeat echocardiogram in 2018 which were unchanged.  I last saw him on a video visit in May, at which time he was doing well.  I asked him to repeat an echocardiogram and follow-up now for an office visit.     He tells me that overall things are about the same and he has been doing well.  He does acknowledge that he was doing less walking and exercise than he had been previously.  As such, he gained about 12 pounds.  Now that he has gotten the vaccine, he is starting back to the gym.  His first day was yesterday.  He was tired but denied that he was having any significant symptoms.     In August 2020, he noted that he was having ankle edema at the end of the day after sitting at his desk.  It was not consistent, however.  He denied shortness of breath or orthopnea.  It seems to have resolved but intermittently does return.     At the dentist office, he noted that his blood pressure was elevated at 140 mmHg systolic.  At home, however he sees blood pressures typically in the 100-1 36 range with an average blood pressure of 120 mmHg or less.  The blood pressures are only in the 130s early in the morning before he has taken his medications.  He denies any lightheadedness, dizziness, or headaches.     His most recent hemoglobin A1c was 7.2% which is up compared to last year when it had gotten down to 6.4%.  He is trying to eat fish at least 4 days a week and has a burger for a treat on Saturdays.  He denies any chest  discomfort or shortness of breath symptoms.    MEDICATIONS:   Current Outpatient Medications:     allopurinol (ZYLOPRIM) 300 MG tablet, Take 300 mg by mouth daily., Disp: , Rfl:     amLODIPine (NORVASC) 5 MG tablet, Take 5 mg by mouth daily, Disp: , Rfl:     aspirin EC 81 MG EC tablet, Take 81 mg by mouth daily., Disp: , Rfl:     atorvastatin (Lipitor) 20 MG tablet, Take 1 tablet (20 mg total) by mouth daily, Disp: 90 tablet, Rfl: 3    Cetirizine HCl (KLS ALLER-TEC PO), Take 10 mg by mouth 2 (two) times daily as needed  , Disp: , Rfl:     glipiZIDE (GLUCOTROL) 10 MG tablet, Take 10 mg by mouth 2 (two) times daily before meals, Disp: , Rfl:     hydrochlorothiazide (HYDRODIURIL) 25 MG tablet, Take 25 mg by mouth daily.  , Disp: , Rfl: 0    lisinopril (PRINIVIL,ZESTRIL) 40 MG tablet, Take 40 mg by mouth daily., Disp: , Rfl:     metoprolol (TOPROL-XL) 100 MG 24 hr tablet, Take 100 mg by mouth 2 (two) times daily.  , Disp: , Rfl:     Multiple Vitamins-Minerals (ZINC PO), Take 500 mg by mouth daily, Disp: ,  Rfl:     SITagliptin-metFORMIN (JANUMET) 50-1000 MG tablet, Take 1 tablet by mouth 2 (two) times daily with meals, Disp: , Rfl:     Vitamin D3 (CHOLECALCIFEROL) 125 MCG (5000 UT) Cap capsule, Take 125 mcg by mouth daily, Disp: , Rfl:      PHYSICAL EXAMINATION  Health Related Quality of Life:     Vital Signs: There were no vitals taken for this visit.   Chest: Clear to auscultation bilaterally  Cardiovascular: No murmurs or gallops.   Abdomen: Soft, nontender. No pulsatile masses or bruits.    Extremities: Warm without edema. Peripheral pulses are full and equal.    ECG: {ekg findings:315101::"normal sinus rhythm at *** bpm","normal axis and intervals","no significant ST/T changes","normal EKG","no significant change from prior"}.     LABS:   Lab Results   Component Value Date    CHOL 96 09/11/2019    TRIG 89 09/11/2019    HDL 35 (L) 09/11/2019    LDL 44 09/11/2019    AST 28 11/15/2016    ALT 35 11/15/2016    TSH 2.26  02/18/2014        IMPRESSION/RECOMMENDATIONS: Mr. Garriga is a 70 y.o. male who presents for follow up. ***     PLAN:

## 2021-02-02 ENCOUNTER — Ambulatory Visit (INDEPENDENT_AMBULATORY_CARE_PROVIDER_SITE_OTHER): Payer: Medicare Other | Admitting: Cardiovascular Disease

## 2021-02-02 ENCOUNTER — Encounter (INDEPENDENT_AMBULATORY_CARE_PROVIDER_SITE_OTHER): Payer: Self-pay | Admitting: Cardiovascular Disease

## 2021-02-02 VITALS — BP 152/81 | HR 66 | Resp 16 | Ht 74.0 in | Wt 222.6 lb

## 2021-02-02 DIAGNOSIS — E7800 Pure hypercholesterolemia, unspecified: Secondary | ICD-10-CM

## 2021-02-02 DIAGNOSIS — I10 Essential (primary) hypertension: Secondary | ICD-10-CM

## 2021-02-02 LAB — ECG 12-LEAD
Atrial Rate: 64 {beats}/min
IHS MUSE NARRATIVE AND IMPRESSION: NORMAL
P Axis: 67 degrees
P-R Interval: 174 ms
Q-T Interval: 436 ms
QRS Duration: 130 ms
QTC Calculation (Bezet): 449 ms
R Axis: -59 degrees
T Axis: 4 degrees
Ventricular Rate: 64 {beats}/min

## 2021-02-02 NOTE — Patient Instructions (Signed)
Check BP twice daily at different times of day and record for 1-2 weeks. Send results via MyChart

## 2021-02-11 ENCOUNTER — Encounter (INDEPENDENT_AMBULATORY_CARE_PROVIDER_SITE_OTHER): Payer: Self-pay

## 2021-02-23 ENCOUNTER — Encounter (INDEPENDENT_AMBULATORY_CARE_PROVIDER_SITE_OTHER): Payer: Self-pay | Admitting: Cardiovascular Disease

## 2021-02-23 ENCOUNTER — Encounter (INDEPENDENT_AMBULATORY_CARE_PROVIDER_SITE_OTHER): Payer: Self-pay

## 2021-05-03 ENCOUNTER — Other Ambulatory Visit (INDEPENDENT_AMBULATORY_CARE_PROVIDER_SITE_OTHER): Payer: Self-pay | Admitting: Nurse Practitioner

## 2021-05-03 DIAGNOSIS — E785 Hyperlipidemia, unspecified: Secondary | ICD-10-CM

## 2021-05-03 DIAGNOSIS — I10 Essential (primary) hypertension: Secondary | ICD-10-CM

## 2021-05-03 MED ORDER — ATORVASTATIN CALCIUM 20 MG PO TABS
20.0000 mg | ORAL_TABLET | Freq: Every day | ORAL | 3 refills | Status: DC
Start: 2021-05-03 — End: 2022-09-13

## 2021-05-03 NOTE — Telephone Encounter (Signed)
Reviewed, JG.

## 2021-05-03 NOTE — Telephone Encounter (Deleted)
Error

## 2021-08-03 NOTE — Progress Notes (Signed)
[facility] [place] OFFICE VISIT    I had the pleasure of seeing [name] today for cardiovascular follow up. He is a pleasant 70 y.o. male with a history of labile hypertension, type 2 diabetes, hyperlipidemia, gout, and a history of a right bundle branch block.  A nuclear stress test in 2015 was normal.  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  For episodes of imbalance and dizziness, he completed a monitor and a repeat echocardiogram in 2018 which were unchanged.  I last saw him in June, at which time his blood pressure was running high in the office.  He sent blood pressures in July, however that showed that it was optimal at home and no changes were made.  He presents today for routine visit.    He tells me that at the time of the blood pressure checks after our last visit, he had increased his lisinopril to 40 mg and also was on a higher dose of amlodipine.  Apparently, there was some confusion regarding the amlodipine with the pharmacy and dosing.  He decided to stop the amlodipine altogether.  For time, he was not checking his blood pressures regularly, however over the past month, his blood pressures have been higher in the 130s to 140s and occasionally into the 150s mmHg.  He denies that he is having any headaches or epistaxis, however.    He is going to the gym 4 days a week and does 20 minutes of treadmill at a 9.5% incline at which time he does not 1 mile.  He also lifts weights for about 40 minutes but limits his weights to 50 pounds only.  He denies that he has any symptoms or limitations including chest discomfort, shortness of breath, lightheadedness, dizziness, or palpitations.    MEDICATIONS:   Current Outpatient Medications:     allopurinol (ZYLOPRIM) 300 MG tablet, Take 300 mg by mouth daily., Disp: , Rfl:     aspirin EC 81 MG EC tablet, Take 81 mg by mouth daily., Disp: , Rfl:     atorvastatin (Lipitor) 20 MG  tablet, Take 1 tablet (20 mg total) by mouth daily, Disp: 90 tablet, Rfl: 3    glipiZIDE (GLUCOTROL) 10 MG tablet, Take 10 mg by mouth 2 (two) times daily before meals, Disp: , Rfl:     hydrochlorothiazide (HYDRODIURIL) 25 MG tablet, Take 25 mg by mouth daily.  , Disp: , Rfl: 0    lisinopril (ZESTRIL) 20 MG tablet, 40 mg, Disp: , Rfl:     metoprolol (TOPROL-XL) 100 MG 24 hr tablet, Take 100 mg by mouth 2 (two) times daily.  , Disp: , Rfl:     Multiple Vitamins-Minerals (ZINC PO), Take 50 mcg by mouth daily, Disp: , Rfl:     SITagliptin-metFORMIN (JANUMET) 50-1000 MG tablet, Take 1 tablet by mouth 2 (two) times daily with meals, Disp: , Rfl:     Vitamin D3 (CHOLECALCIFEROL) 125 MCG (5000 UT) Cap capsule, Take 125 mcg by mouth daily, Disp: , Rfl:     amLODIPine (NORVASC) 5 MG tablet, Take 1 tablet (5 mg) by mouth daily, Disp: 90 tablet, Rfl: 3     PHYSICAL EXAMINATION  Health Related Quality of Life:     Vital Signs: BP 140/90 (BP Site: Left arm, Patient Position: Sitting, Cuff Size: Medium)    Pulse (!) 58    Resp 12    Ht 1.88 m (6\' 2" )  Wt 103 kg (227 lb)    SpO2 95%    BMI 29.15 kg/m    Chest: Clear to auscultation bilaterally  Cardiovascular: No murmurs or gallops.   Abdomen: Soft, nontender. No pulsatile masses or bruits.    Extremities: Warm without edema. Peripheral pulses are full and equal.    IMPRESSION/RECOMMENDATIONS: [name] is a 70 y.o. male who presents for follow-up.  Clinically he is doing well, however his blood pressures are not at goal.  He remains on 40 mg of lisinopril but is off of the amlodipine with higher than desired blood pressures as a result.  I have asked him to restart the amlodipine at 5 mg daily.  He will check his blood pressures over the next few weeks and contact me with results.    He is due for fasting blood work to check up on his lipids.  He tells me that he will not be seeing his PCP until May.  Therefore, I have asked him to complete the testing now.  I did give him a  requisition.  He is going to [place] and thereafter traveling for several months.  He will be back in this area in May.  At that time, I have asked him to repeat an echocardiogram to follow-up on his aortic diameter.  He will follow-up with me at that time.  I have asked him to repeat fasting lipids at that time as well.     PLAN:  1.  Restart amlodipine 5 mg daily  2.  Check blood pressures as discussed  3.  Fasting laboratory studies now  4.  Echocardiogram and fasting laboratory studies in 6 months followed by office visit

## 2021-08-05 ENCOUNTER — Ambulatory Visit (INDEPENDENT_AMBULATORY_CARE_PROVIDER_SITE_OTHER): Payer: Medicare Other | Admitting: Cardiovascular Disease

## 2021-08-05 ENCOUNTER — Encounter (INDEPENDENT_AMBULATORY_CARE_PROVIDER_SITE_OTHER): Payer: Self-pay | Admitting: Cardiovascular Disease

## 2021-08-05 VITALS — BP 140/90 | HR 58 | Resp 12 | Ht 74.0 in | Wt 227.0 lb

## 2021-08-05 DIAGNOSIS — I10 Essential (primary) hypertension: Secondary | ICD-10-CM

## 2021-08-05 DIAGNOSIS — E785 Hyperlipidemia, unspecified: Secondary | ICD-10-CM

## 2021-08-05 DIAGNOSIS — I712 Thoracic aortic aneurysm, without rupture, unspecified: Secondary | ICD-10-CM

## 2021-08-05 MED ORDER — AMLODIPINE BESYLATE 5 MG PO TABS
5.0000 mg | ORAL_TABLET | Freq: Every day | ORAL | 3 refills | Status: DC
Start: 2021-08-05 — End: 2022-09-13

## 2021-08-05 NOTE — Patient Instructions (Addendum)
Start amlodipine 5 mg daily    Check BPs at home at different times of day and record    Complete fasting labs    Echo  and fasting labs in May/June followed by office visit

## 2021-08-06 ENCOUNTER — Other Ambulatory Visit (INDEPENDENT_AMBULATORY_CARE_PROVIDER_SITE_OTHER): Payer: Self-pay | Admitting: Cardiovascular Disease

## 2021-08-06 LAB — LIPID PANEL
Cholesterol / HDL Ratio: 2.7 (calc) (ref <5.0)
Cholesterol: 106 mg/dL (ref <200)
HDL: 39 mg/dL — ABNORMAL LOW (ref >=40)
LDL Calculated: 49 mg/dL (calc)
NON HDL CHOLESTEROL: 67 mg/dL (calc) (ref <130)
Triglycerides: 96 mg/dL (ref <150)

## 2021-08-06 LAB — COMPREHENSIVE METABOLIC PANEL
ALT: 43 U/L (ref 9–46)
AST (SGOT): 25 U/L (ref 10–35)
Albumin/Globulin Ratio: 2 (calc) (ref 1.0–2.5)
Albumin: 4.3 g/dL (ref 3.6–5.1)
Alkaline Phosphatase: 60 U/L (ref 35–144)
BUN: 20 mg/dL (ref 7–25)
Bilirubin, Total: 0.4 mg/dL (ref 0.2–1.2)
CO2: 30 mmol/L (ref 20–32)
Calcium: 9.3 mg/dL (ref 8.6–10.3)
Chloride: 105 mmol/L (ref 98–110)
Creatinine: 1.03 mg/dL (ref 0.70–1.35)
Globulin: 2.1 g/dL (calc) (ref 1.9–3.7)
Glucose: 256 mg/dL — ABNORMAL HIGH (ref 65–99)
Potassium: 4.5 mmol/L (ref 3.5–5.3)
Protein, Total: 6.4 g/dL (ref 6.1–8.1)
Sodium: 141 mmol/L (ref 135–146)
eGFR: 79 mL/min/{1.73_m2} (ref >=60)

## 2021-08-11 ENCOUNTER — Telehealth (INDEPENDENT_AMBULATORY_CARE_PROVIDER_SITE_OTHER): Payer: Self-pay

## 2021-08-11 NOTE — Telephone Encounter (Signed)
Component      Latest Ref Rng & Units 08/06/2021   Glucose      65 - 99 mg/dL 213 (H)   BUN      7 - 25 mg/dL 20   Creatinine      0.86 - 1.35 mg/dL 5.78   EGFR      > OR = 60 mL/min/1.12m2 79   BUN / Creatinine Ratio      6 - 22 (calc) NOT APPLICABLE   Sodium      135 - 146 mmol/L 141   Potassium      3.5 - 5.3 mmol/L 4.5   Chloride      98 - 110 mmol/L 105   CO2      20 - 32 mmol/L 30   Calcium      8.6 - 10.3 mg/dL 9.3   Protein Total      6.1 - 8.1 g/dL 6.4   Albumin      3.6 - 5.1 g/dL 4.3   Globulin      1.9 - 3.7 g/dL (calc) 2.1   Albumin/Globulin Ratio      1.0 - 2.5 (calc) 2.0   Bilirubin Total      0.2 - 1.2 mg/dL 0.4   Alkaline Phosphatase      35 - 144 U/L 60   AST      10 - 35 U/L 25   ALT      9 - 46 U/L 43   Cholesterol      <200 mg/dL 469   HDL      > OR = 40 mg/dL 39 (L)   Triglycerides      <150 mg/dL 96   LDL Calculated      mg/dL (calc) 49   Cholesterol / HDL Ratio      <5.0 (calc) 2.7   NON HDL CHOLESTEROL      <130 mg/dL (calc) 67     Pt aware       Endocrine following sugars

## 2021-12-21 ENCOUNTER — Ambulatory Visit
Admission: RE | Admit: 2021-12-21 | Discharge: 2021-12-21 | Disposition: A | Discharge status: Home / Self Care | Payer: Medicare Other | Source: Ambulatory Visit | Attending: Cardiovascular Disease | Admitting: Cardiovascular Disease

## 2021-12-21 DIAGNOSIS — I712 Thoracic aortic aneurysm, without rupture, unspecified: Secondary | ICD-10-CM | POA: Insufficient documentation

## 2021-12-21 DIAGNOSIS — I10 Essential (primary) hypertension: Secondary | ICD-10-CM | POA: Insufficient documentation

## 2021-12-21 DIAGNOSIS — E785 Hyperlipidemia, unspecified: Secondary | ICD-10-CM | POA: Insufficient documentation

## 2021-12-24 LAB — ECHOCARDIOGRAM ADULT COMPLETE W CLR/ DOPP WAVEFORM
AV Area (Cont Eq VTI): 3.7
AV Area (Cont Eq VTI): 3.704
AV Mean Gradient: 4
AV Peak Velocity: 137
Ao Root Diameter (2D): 4.6
BP Mod LV Ejection Fraction: 57.789
IVS Diastolic Thickness (2D): 0.877
LA Dimension (2D): 3.4
LA Volume Index (BP A-L): 0.031
LVID diastole (2D): 5.68
LVID systole (2D): 2.75
MV Area (PHT): 2.479
MV E/A: 1.2
MV E/A: 1.209
MV E/e' (Average): 10.981
Mitral Valve Findings: NORMAL
Prox Ascending Aorta Diameter: 3.9
Pulmonary Valve Findings: NORMAL
RV Basal Diastolic Dimension: 3.75
RV Function: NORMAL
RV Systolic Pressure: 29.42
Site RV Size (AS): NORMAL
TAPSE: 2.58
Tricuspid Valve Findings: NORMAL

## 2021-12-28 NOTE — Progress Notes (Signed)
[facility] [place] OFFICE VISIT    I had the pleasure of seeing Mr. Brodhead today for cardiovascular follow up. He is a pleasant 71 y.o. male with a history of labile hypertension, type 2 diabetes, hyperlipidemia, gout, and a history of a right bundle branch block.  A nuclear stress test in 2015 was normal.  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  For episodes of imbalance and dizziness, he completed a monitor and a repeat echocardiogram in 2018 which were unchanged.  He presents today for follow-up.    He was last seen by Dr. Deetta Perla in December 2022 at which time he was asked to increase amlodipine from 2.5 mg once a day to 5 mg once a day given that his blood pressure readings were in the 130s and 140s and occasionally 150s.  Since then he has been having blood pressure readings in the 120s over 70s.  His blood pressure today is 127/70.  He reports that he has slight swelling on his lower extremities, bilateral upper extremities, and some facial swelling which he reports he can live with.  He is pleased with his blood pressure readings that he would like to stay on the current dose and monitor his symptoms.  He exercises regularly, 4 days a week doing weight training, brisk walking with his dog for about an hour to an hour and a half without any chest pain, shortness of breath, or effort intolerance.       MEDICATIONS:   Current Outpatient Medications:     allopurinol (ZYLOPRIM) 300 MG tablet, Take 1 tablet (300 mg) by mouth daily, Disp: , Rfl:     amLODIPine (NORVASC) 5 MG tablet, Take 1 tablet (5 mg) by mouth daily, Disp: 90 tablet, Rfl: 3    aspirin EC 81 MG EC tablet, Take 1 tablet (81 mg) by mouth daily, Disp: , Rfl:     atorvastatin (Lipitor) 20 MG tablet, Take 1 tablet (20 mg total) by mouth daily, Disp: 90 tablet, Rfl: 3    glipiZIDE (GLUCOTROL) 10 MG tablet, Take 1 tablet (10 mg) by mouth 2 (two) times daily before meals,  Disp: , Rfl:     hydrochlorothiazide (HYDRODIURIL) 25 MG tablet, Take 1 tablet (25 mg) by mouth daily, Disp: , Rfl: 0    lisinopril (ZESTRIL) 40 MG tablet, Take 1 tablet (40 mg) by mouth daily, Disp: , Rfl:     metoprolol (TOPROL-XL) 100 MG 24 hr tablet, Take 1 tablet (100 mg) by mouth 2 (two) times daily, Disp: , Rfl:     Multiple Vitamins-Minerals (ZINC PO), Take 50 mcg by mouth daily, Disp: , Rfl:     SITagliptin-metFORMIN (JANUMET) 50-1000 MG tablet, Take 1 tablet by mouth 2 (two) times daily with meals, Disp: , Rfl:     Vitamin D3 (CHOLECALCIFEROL) 125 MCG (5000 UT) Cap capsule, Take 1 capsule (125 mcg) by mouth daily, Disp: , Rfl:      PHYSICAL EXAMINATION  Health Related Quality of Life: Good   Vital Signs: BP 127/70 (BP Site: Left arm, Patient Position: Sitting, Cuff Size: Medium)   Pulse 63   Ht 1.905 m (6\' 3" )   Wt 99.3 kg (219 lb)   SpO2 98%   BMI 27.37 kg/m    Chest: Clear to auscultation bilaterally  Cardiovascular: No murmurs or gallops.   Abdomen: Soft, nontender. No pulsatile masses or bruits.    Extremities: Warm without edema. Peripheral pulses are full  and equal.    ECG: normal EKG, normal sinus rhythm, RBBB, PAC's noted, left axis deviation, no significant change from prior.     LABS:   Lab Results   Component Value Date    NA 141 08/06/2021    K 4.5 08/06/2021    BUN 20 08/06/2021    CREAT 1.03 08/06/2021    GLU 256 (H) 08/06/2021    CHOL 106 08/06/2021    TRIG 96 08/06/2021    HDL 39 (L) 08/06/2021    LDL 49 08/06/2021    AST 25 08/06/2021    ALT 43 08/06/2021    TSH 2.26 02/18/2014        Echocardiogram 12/21/2021:     Summary    * Left ventricular systolic function is normal with an ejection fraction by  Biplane Method of Discs of  58 %.    * Normal right ventricular systolic function.    * The right atrium is mildly dilated.    * There is trace to mild mitral regurgitation.    * There is trace tricuspid regurgitation.    * No pulmonary hypertension with estimated right ventricular  systolic  pressure of  29 mmHg.    * The aortic root is moderately dilated. 4.6 cm.    * The ascending aorta is mildly dilated. 3.9 cm.    * Compared to the prior study dated 12/11/20, there has been no significant  change.    IMPRESSION/RECOMMENDATIONS: Mr. Whetsel is a 71 y.o. male who presents for follow up.  Patient has a history of hypertension and is currently on amlodipine 5 mg once a day, hydrochlorothiazide 25 mg once a day, and lisinopril 40 mg once a day.  His most recent BMP is within normal range.  His blood pressure has been around 120s over 70s.  He has slight upper, lower extremities and some facial swelling.  Patient would like to stay on his current dose of amlodipine 5 mg once a day.  I had offered for him to cut down his amlodipine to 2.5 to see if the swelling is caused by the 5 mg of amlodipine but patient is okay staying on 5 mg of amlodipine at this time.  He will continue to monitor his symptoms and follow-up with [place] in 6 months.    We discussed the reading of his echocardiogram which showed normal EF of 58% with trace to mild mitral regurgitation and trace tricuspid regurgitation, aortic root is measuring 4.6 cm and ascending aorta is measuring 3.9 cm.  This is essentially unchanged compared to his prior echocardiogram.    Today no changes were made to his current medications.  He will have a repeat echocardiogram in 1 year.  He will have BMP in 6 months.  He will follow-up with [place] in 6 months, or sooner as needed.

## 2021-12-29 ENCOUNTER — Ambulatory Visit (INDEPENDENT_AMBULATORY_CARE_PROVIDER_SITE_OTHER): Payer: Medicare Other | Admitting: Nurse Practitioner

## 2021-12-29 ENCOUNTER — Encounter (INDEPENDENT_AMBULATORY_CARE_PROVIDER_SITE_OTHER): Payer: Self-pay | Admitting: Nurse Practitioner

## 2021-12-29 VITALS — BP 127/70 | HR 63 | Ht 75.0 in | Wt 219.0 lb

## 2021-12-29 DIAGNOSIS — E785 Hyperlipidemia, unspecified: Secondary | ICD-10-CM

## 2021-12-29 DIAGNOSIS — E7800 Pure hypercholesterolemia, unspecified: Secondary | ICD-10-CM

## 2021-12-29 DIAGNOSIS — I712 Thoracic aortic aneurysm, without rupture, unspecified: Secondary | ICD-10-CM

## 2021-12-29 DIAGNOSIS — I10 Essential (primary) hypertension: Secondary | ICD-10-CM

## 2021-12-29 NOTE — Patient Instructions (Signed)
No changes to current medications.   F/U with our office in 6 month.

## 2021-12-31 LAB — ECG 12-LEAD
Atrial Rate: 61 {beats}/min
P Axis: 58 degrees
P-R Interval: 176 ms
Q-T Interval: 450 ms
QRS Duration: 132 ms
QTC Calculation (Bezet): 453 ms
R Axis: -64 degrees
T Axis: -43 degrees
Ventricular Rate: 61 {beats}/min

## 2022-02-01 ENCOUNTER — Other Ambulatory Visit (INDEPENDENT_AMBULATORY_CARE_PROVIDER_SITE_OTHER): Payer: Self-pay | Admitting: Cardiovascular Disease

## 2022-02-01 LAB — COMPREHENSIVE METABOLIC PANEL
ALT: 28 U/L (ref 9–46)
AST (SGOT): 20 U/L (ref 10–35)
Albumin/Globulin Ratio: 1.8 (calc) (ref 1.0–2.5)
Albumin: 4.2 g/dL (ref 3.6–5.1)
Alkaline Phosphatase: 53 U/L (ref 35–144)
BUN: 20 mg/dL (ref 7–25)
Bilirubin, Total: 0.8 mg/dL (ref 0.2–1.2)
CO2: 30 mmol/L (ref 20–32)
Calcium: 9.7 mg/dL (ref 8.6–10.3)
Chloride: 105 mmol/L (ref 98–110)
Creatinine: 0.95 mg/dL (ref 0.70–1.28)
Globulin: 2.3 g/dL (calc) (ref 1.9–3.7)
Glucose: 120 mg/dL — ABNORMAL HIGH (ref 65–99)
Potassium: 4.3 mmol/L (ref 3.5–5.3)
Protein, Total: 6.5 g/dL (ref 6.1–8.1)
Sodium: 143 mmol/L (ref 135–146)
eGFR: 86 mL/min/{1.73_m2} (ref >=60)

## 2022-02-01 LAB — LIPID PANEL
Cholesterol / HDL Ratio: 2.3 (calc) (ref <5.0)
Cholesterol: 96 mg/dL (ref <200)
HDL: 42 mg/dL (ref >=40)
LDL Calculated: 37 mg/dL (calc)
NON HDL CHOLESTEROL: 54 mg/dL (calc) (ref <130)
Triglycerides: 87 mg/dL (ref <150)

## 2022-05-30 NOTE — Progress Notes (Unsigned)
[facility] [place] OFFICE VISIT    I had the pleasure of seeing Mr. Cavanagh today for cardiovascular follow up. He is a pleasant 71 y.o. male with a history of labile hypertension, type 2 diabetes, hyperlipidemia, gout, and a history of a right bundle branch block.  A nuclear stress test in 2015 was normal.  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  For episodes of imbalance and dizziness, he completed a monitor and a repeat echocardiogram in 2018 which were unchanged.  who presents ***     MEDICATIONS:   Current Outpatient Medications:     allopurinol (ZYLOPRIM) 300 MG tablet, Take 1 tablet (300 mg) by mouth daily, Disp: , Rfl:     amLODIPine (NORVASC) 5 MG tablet, Take 1 tablet (5 mg) by mouth daily (Patient taking differently: Take 0.5 tablets (2.5 mg) by mouth daily), Disp: 90 tablet, Rfl: 3    aspirin EC 81 MG EC tablet, Take 1 tablet (81 mg) by mouth daily, Disp: , Rfl:     atorvastatin (Lipitor) 20 MG tablet, Take 1 tablet (20 mg total) by mouth daily, Disp: 90 tablet, Rfl: 3    glipiZIDE (GLUCOTROL) 10 MG tablet, Take 1 tablet (10 mg) by mouth 2 (two) times daily before meals, Disp: , Rfl:     hydrochlorothiazide (HYDRODIURIL) 25 MG tablet, Take 1 tablet (25 mg) by mouth daily, Disp: , Rfl: 0    lisinopril (ZESTRIL) 40 MG tablet, Take 1 tablet (40 mg) by mouth daily, Disp: , Rfl:     metoprolol (TOPROL-XL) 100 MG 24 hr tablet, Take 1 tablet (100 mg) by mouth 2 (two) times daily, Disp: , Rfl:     Multiple Vitamins-Minerals (ZINC PO), Take 50 mcg by mouth daily, Disp: , Rfl:     SITagliptin-metFORMIN (JANUMET) 50-1000 MG tablet, Take 1 tablet by mouth 2 (two) times daily with meals, Disp: , Rfl:     Vitamin D3 (CHOLECALCIFEROL) 125 MCG (5000 UT) Cap capsule, Take 1 capsule (125 mcg) by mouth daily, Disp: , Rfl:      PHYSICAL EXAMINATION  Health Related Quality of Life:     Vital Signs: There were no vitals taken for this visit.    Chest: Clear to auscultation bilaterally  Cardiovascular: No murmurs or gallops.   Abdomen: Soft, nontender. No pulsatile masses or bruits.    Extremities: Warm without edema. Peripheral pulses are full and equal.    ECG: {ekg findings:315101::"normal sinus rhythm at *** bpm","normal axis and intervals","no significant ST/T changes","normal EKG","no significant change from prior"}.     LABS:   Lab Results   Component Value Date    NA 143 02/01/2022    K 4.3 02/01/2022    BUN 20 02/01/2022    CREAT 0.95 02/01/2022    GLU 120 (H) 02/01/2022    CHOL 96 02/01/2022    TRIG 87 02/01/2022    HDL 42 02/01/2022    LDL 37 02/01/2022    AST 20 02/01/2022    ALT 28 02/01/2022    TSH 2.26 02/18/2014        IMPRESSION/RECOMMENDATIONS: Mr. Vasek is a 71 y.o. male who presents for follow up. ***    PLAN:

## 2022-05-31 ENCOUNTER — Encounter (INDEPENDENT_AMBULATORY_CARE_PROVIDER_SITE_OTHER): Payer: Self-pay | Admitting: Nurse Practitioner

## 2022-05-31 ENCOUNTER — Ambulatory Visit (INDEPENDENT_AMBULATORY_CARE_PROVIDER_SITE_OTHER): Payer: Medicare Other | Admitting: Nurse Practitioner

## 2022-05-31 VITALS — BP 130/73 | HR 62 | Ht 75.0 in | Wt 223.0 lb

## 2022-05-31 DIAGNOSIS — Z0000 Encounter for general adult medical examination without abnormal findings: Secondary | ICD-10-CM

## 2022-05-31 DIAGNOSIS — I10 Essential (primary) hypertension: Secondary | ICD-10-CM

## 2022-05-31 DIAGNOSIS — E785 Hyperlipidemia, unspecified: Secondary | ICD-10-CM

## 2022-05-31 LAB — ECG 12-LEAD
Atrial Rate: 62 {beats}/min
IHS MUSE NARRATIVE AND IMPRESSION: NORMAL
P Axis: 67 degrees
P-R Interval: 172 ms
Q-T Interval: 454 ms
QRS Duration: 148 ms
QTC Calculation (Bezet): 460 ms
R Axis: -67 degrees
T Axis: -55 degrees
Ventricular Rate: 62 {beats}/min

## 2022-05-31 NOTE — Patient Instructions (Signed)
Echo is scheduled for May 9th 2024.   No changes to your current medications.   Complete echocardiogram prior to your next visit in May or June 2024.

## 2022-09-13 ENCOUNTER — Other Ambulatory Visit (INDEPENDENT_AMBULATORY_CARE_PROVIDER_SITE_OTHER): Payer: Self-pay | Admitting: Cardiovascular Disease

## 2022-09-13 DIAGNOSIS — I10 Essential (primary) hypertension: Secondary | ICD-10-CM

## 2022-09-13 DIAGNOSIS — E785 Hyperlipidemia, unspecified: Secondary | ICD-10-CM

## 2022-09-13 NOTE — Telephone Encounter (Addendum)
Called patient to confirm he requested refills for Atorvastatin 20mg  and Amlodipine 5mg  to be sent to [facility] in [place], [place]    Will wait for patient to call back and clarify that.

## 2022-09-20 MED ORDER — AMLODIPINE BESYLATE 5 MG PO TABS
5.0000 mg | ORAL_TABLET | Freq: Every day | ORAL | 3 refills | Status: AC
Start: 2022-09-20 — End: ?

## 2022-09-20 MED ORDER — ATORVASTATIN CALCIUM 20 MG PO TABS
20.0000 mg | ORAL_TABLET | Freq: Every day | ORAL | 3 refills | Status: AC
Start: 2022-09-20 — End: ?

## 2022-09-20 NOTE — Telephone Encounter (Signed)
Prescription Refill Checklist    Completed Process Reviewed Notes   [x]  Verified med on pt chart.    [x]  2.  Reviewed patient allergies    [x]  3.  Review last encounters since OV Last OV 05/31/22   [x]  4.  Determine if pt has f/u as instructed or needs OV Next appt05/21/24   [x]  5.  If OV required, pend 30 day supply of medication    [x]  6.  Review labs are up to date. Last Labs 02/01/22   []  7. Filled by RN 90 day x 0 May only be done 1 x yearly   DATE:         Reviewed - D.A.

## 2022-12-14 ENCOUNTER — Other Ambulatory Visit (INDEPENDENT_AMBULATORY_CARE_PROVIDER_SITE_OTHER): Payer: Self-pay | Admitting: Nurse Practitioner

## 2022-12-14 LAB — COMPREHENSIVE METABOLIC PANEL
ALT: 32 U/L (ref 9–46)
AST (SGOT): 26 U/L (ref 10–35)
Albumin/Globulin Ratio: 1.6 (calc) (ref 1.0–2.5)
Albumin: 4.2 g/dL (ref 3.6–5.1)
Alkaline Phosphatase: 55 U/L (ref 35–144)
BUN: 20 mg/dL (ref 7–25)
Bilirubin, Total: 1 mg/dL (ref 0.2–1.2)
CO2: 30 mmol/L (ref 20–32)
Calcium: 9.6 mg/dL (ref 8.6–10.3)
Chloride: 105 mmol/L (ref 98–110)
Creatinine: 0.92 mg/dL (ref 0.70–1.28)
Globulin: 2.6 g/dL (calc) (ref 1.9–3.7)
Glucose: 92 mg/dL (ref 65–99)
Potassium: 4.4 mmol/L (ref 3.5–5.3)
Protein, Total: 6.8 g/dL (ref 6.1–8.1)
Sodium: 141 mmol/L (ref 135–146)
eGFR: 89 mL/min/{1.73_m2} (ref >=60)

## 2022-12-14 LAB — LIPID PANEL
Cholesterol / HDL Ratio: 2.5 (calc) (ref <5.0)
Cholesterol: 117 mg/dL (ref <200)
HDL: 46 mg/dL (ref >=40)
LDL Calculated: 52 mg/dL (calc)
NON HDL CHOLESTEROL: 71 mg/dL (calc) (ref <130)
Triglycerides: 103 mg/dL (ref <150)

## 2022-12-16 ENCOUNTER — Encounter (INDEPENDENT_AMBULATORY_CARE_PROVIDER_SITE_OTHER): Payer: Self-pay

## 2022-12-22 ENCOUNTER — Ambulatory Visit
Admission: RE | Admit: 2022-12-22 | Discharge: 2022-12-22 | Disposition: A | Discharge status: Home / Self Care | Payer: Medicare Other | Source: Ambulatory Visit | Attending: Nurse Practitioner | Admitting: Nurse Practitioner

## 2022-12-22 DIAGNOSIS — I712 Thoracic aortic aneurysm, without rupture, unspecified: Secondary | ICD-10-CM | POA: Insufficient documentation

## 2022-12-22 DIAGNOSIS — E785 Hyperlipidemia, unspecified: Secondary | ICD-10-CM | POA: Insufficient documentation

## 2022-12-22 DIAGNOSIS — E7800 Pure hypercholesterolemia, unspecified: Secondary | ICD-10-CM | POA: Insufficient documentation

## 2022-12-22 DIAGNOSIS — I10 Essential (primary) hypertension: Secondary | ICD-10-CM

## 2022-12-23 LAB — ECHO ADULT TTE COMPLETE
AV Area (Cont Eq VTI): 2.607
AV Area (Cont Eq VTI): 2.61
AV Mean Gradient: 3
AV Peak Velocity: 1.33
Ao Root Diameter (2D): 4.5
BP Mod LV Ejection Fraction: 60.2
IVS Diastolic Thickness (2D): 1.48
LA Volume Index (BP A-L): 18
LVID diastole (2D): 5.64
LVID systole (2D): 3.77
MV Area (PHT): 3.27
MV E/A: 0.888
MV E/A: 0.9
MV E/e' (Average): 8.085
Prox Ascending Aorta Diameter: 3.7
Pulmonary Valve Findings: NORMAL
RV Basal Diastolic Dimension: 3.99
RV Function: NORMAL
Site RA Size (AS): NORMAL
Site RV Size (AS): NORMAL
Summary: NORMAL
TAPSE: 2.01
Tricuspid Valve Findings: NORMAL

## 2023-01-03 ENCOUNTER — Ambulatory Visit (INDEPENDENT_AMBULATORY_CARE_PROVIDER_SITE_OTHER): Payer: Medicare Other | Admitting: Nurse Practitioner

## 2023-01-03 ENCOUNTER — Encounter (INDEPENDENT_AMBULATORY_CARE_PROVIDER_SITE_OTHER): Payer: Self-pay | Admitting: Nurse Practitioner

## 2023-01-03 ENCOUNTER — Encounter (INDEPENDENT_AMBULATORY_CARE_PROVIDER_SITE_OTHER): Payer: Self-pay

## 2023-01-03 VITALS — BP 115/70 | HR 63 | Ht 74.0 in | Wt 218.0 lb

## 2023-01-03 DIAGNOSIS — E7800 Pure hypercholesterolemia, unspecified: Secondary | ICD-10-CM

## 2023-01-03 DIAGNOSIS — I712 Thoracic aortic aneurysm, without rupture, unspecified: Secondary | ICD-10-CM

## 2023-01-03 DIAGNOSIS — I10 Essential (primary) hypertension: Secondary | ICD-10-CM

## 2023-01-03 NOTE — Progress Notes (Signed)
[facility] [place]  OFFICE VISIT    I had the pleasure of seeing [name] today for cardiovascular follow up. He is a pleasant 72 y.o. male with a history of hypertension, type 2 diabetes, hyperlipidemia, gout, right bundle branch block, and thoracic aortic aneurysm who presents for 27-month office visit.  Patient exercises several times a week.  He does weight training about 2 times a week and the other 4 days a week he walks his dog for about 1 to 2 miles without any chest pain, shortness of breath, or effort intolerance.    He has lost 5  lbs since December.   BP at home has been 130/72, 138/86, 115/86, 125/75.     About a week ago he was seen by his primary care physician and was noted to have dry cough.  He was switched from lisinopril to losartan 50 mg once a day.  The cough has subsided.  Since the switch to losartan, his blood pressure has been around 110 03/16/2019 over 70s to 80s.    He completed echocardiogram on Dec 22, 2022.    MEDICATIONS:   Current Outpatient Medications:     allopurinol (ZYLOPRIM) 300 MG tablet, Take 1 tablet (300 mg) by mouth daily, Disp: , Rfl:     amLODIPine (NORVASC) 5 MG tablet, Take 1 tablet (5 mg) by mouth daily, Disp: 90 tablet, Rfl: 3    aspirin EC 81 MG EC tablet, Take 1 tablet (81 mg) by mouth daily, Disp: , Rfl:     atorvastatin (Lipitor) 20 MG tablet, Take 1 tablet (20 mg) by mouth daily, Disp: 90 tablet, Rfl: 3    glipiZIDE (GLUCOTROL) 10 MG tablet, Take 1 tablet (10 mg) by mouth 2 (two) times daily before meals, Disp: , Rfl:     hydrochlorothiazide (HYDRODIURIL) 25 MG tablet, Take 1 tablet (25 mg) by mouth daily, Disp: , Rfl: 0    losartan (COZAAR) 50 MG tablet, , Disp: , Rfl:     metoprolol (TOPROL-XL) 100 MG 24 hr tablet, Take 1 tablet (100 mg) by mouth 2 (two) times daily, Disp: , Rfl:     Multiple Vitamins-Minerals (ZINC PO), Take 50 mcg by mouth daily, Disp: , Rfl:     Synjardy XR 12-998 MG Tablet SR 24 hr, , Disp: , Rfl:     Vitamin D3 (CHOLECALCIFEROL) 125  MCG (5000 UT) Cap capsule, Take 1 capsule (125 mcg) by mouth daily, Disp: , Rfl:      REVIEW OF SYSTEMS: All other systems reviewed and negative except as above.    PHYSICAL EXAMINATION  General Appearance: A well-appearing male in no acute distress.   Vital Signs: BP 115/70 (BP Site: Left arm, Patient Position: Sitting, Cuff Size: Medium)   Pulse 63   Ht 1.88 m (6\' 2" )   Wt 98.9 kg (218 lb)   SpO2 96%   BMI 27.99 kg/m    HEENT: Sclera anicteric, conjunctiva without pallor, moist mucous membranes, normal dentition.   Neck: Supple without jugular venous distention. Thyroid nonpalpable. Normal carotid upstrokes without bruits.  Chest: Clear to auscultation bilaterally with good air movement and respiratory effort and no wheezes, rales, or rhonchi  Cardiovascular: Normal S1 and physiologically split S2 without murmurs, gallops or rub. PMI of normal size and nondisplaced.   Abdomen: Soft, nontender. No organomegaly.  No pulsatile masses or bruits.    Extremities: Warm without edema. All peripheral pulses are full and equal.  Skin: No rash, xanthoma or xanthelasma.   Neuro:  Alert and oriented x3. Grossly intact. Strength is symmetrical. Normal mood and affect.       LABS:   Lab Results   Component Value Date    NA 141 12/14/2022    K 4.4 12/14/2022    BUN 20 12/14/2022    CREAT 0.92 12/14/2022    GLU 92 12/14/2022    CHOL 117 12/14/2022    TRIG 103 12/14/2022    HDL 46 12/14/2022    LDL 52 12/14/2022    AST 26 12/14/2022    ALT 32 12/14/2022    TSH 2.26 02/18/2014      Echocardiogram 12/22/2022:     Summary    * There is moderate concentric left ventricular hypertrophy.    * Left ventricular systolic function is normal with an ejection fraction by  Biplane Method of Discs of  60 %.    * Normal right ventricular systolic function.    * There is bi-leaflet flattening of the mitral valve leaflets but no  prolapse is noted.    * There is mild mitral regurgitation.    * There is trace tricuspid regurgitation.    * No  pulmonary hypertension with estimated right ventricular systolic  pressure of 32 mmHg.    * The aortic root is moderately dilated at 4.5 cm in diameter.    * The ascending aorta is mildly dilated at 3.7 cm in diameter.    * Compared to the prior study dated 12/21/21, the aortic diameters appear  decreased in diameter.    IMPRESSION/RECOMMENDATIONS: Noah Wells is a 72 y.o. male who presents for follow up.  Patient's blood pressure at this time is adequately controlled.  He is currently on losartan 50 mg once a day, amlodipine 5 mg once a day, Toprol XL 100 mg once a day, hydrochlorothiazide 25 mg once a day and his blood pressure today is 115/70 with a heart rate of 63.    His echocardiogram was done on Dec 22, 2022 which showed EF of 60% with mild mitral regurgitation, trace tricuspid regurgitation, aortic root measuring 4.5 cm and ascending aorta measuring 3.7 cm.  Compared to his prior echocardiogram of 12/21/2021 his echocardiogram is essentially unchanged.    His most recent lipid profile is from Dec 14, 2022 that showed total cholesterol of 117, HDL 46, triglycerides 103,    At this time no changes have been made to his current medications.  Patient will follow-up with our office in 6 months or sooner as needed.    Incident to service performed with physician present in the office in accordance with this patient's established plan of care.

## 2023-09-05 IMAGING — MR MULTI PARAMETRIC MRI PROSTATE W/O AND W CONTRAST
13 series · 48 of 48 positions shown · IV contrast (gadavist)
Comparison: None

________________________________________________________________________________________________ 
MULTI PARAMETRIC MRI PROSTATE W/O AND W CONTRAST, 09/05/2023 [DATE]: 
CLINICAL INDICATION: Elevated Prostate Specific Antigen [psa]
TECHNIQUE: Multiple parametric sequences were performed Pre-contrast: T1 axial 
of the entire pelvis. T2 sagittal, axial  and coronal,T1 axial, acquired of the 
prostate. Diffusion with multiple B values of 4999, 0355 calculated ADC value 
for mapping. Post contrast: Rapid sequence dynamic and axial planes through the 
prostate and seminal vesicles,T1 axial with fat sat of the entire pelvis. 3-D 
renderings were reconstructed on an independent workstation. The images were 
also evaluated with Dyna CAD computer aided detection. 10.0 mL of Gadavist were 
injected intravenously. As per [HOSPITAL] guidelines 3D 
reconstructions are performed with concurrent physician supervision. Patient was 
scanned on a 3T magnet.

[Series 101: survey-mst · axial · 10.0mm · 1.34mm/px · 1 of 14 slices shown]
[im 1/14]
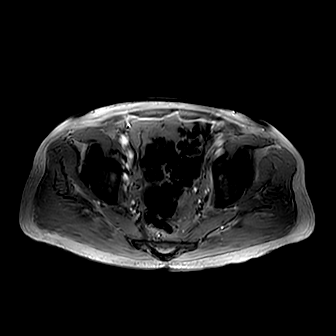

[Series 201: t1w_tse_ax · axial · 6.0mm · 0.37mm/px · 1 of 36 slices shown]
[im 1/36]
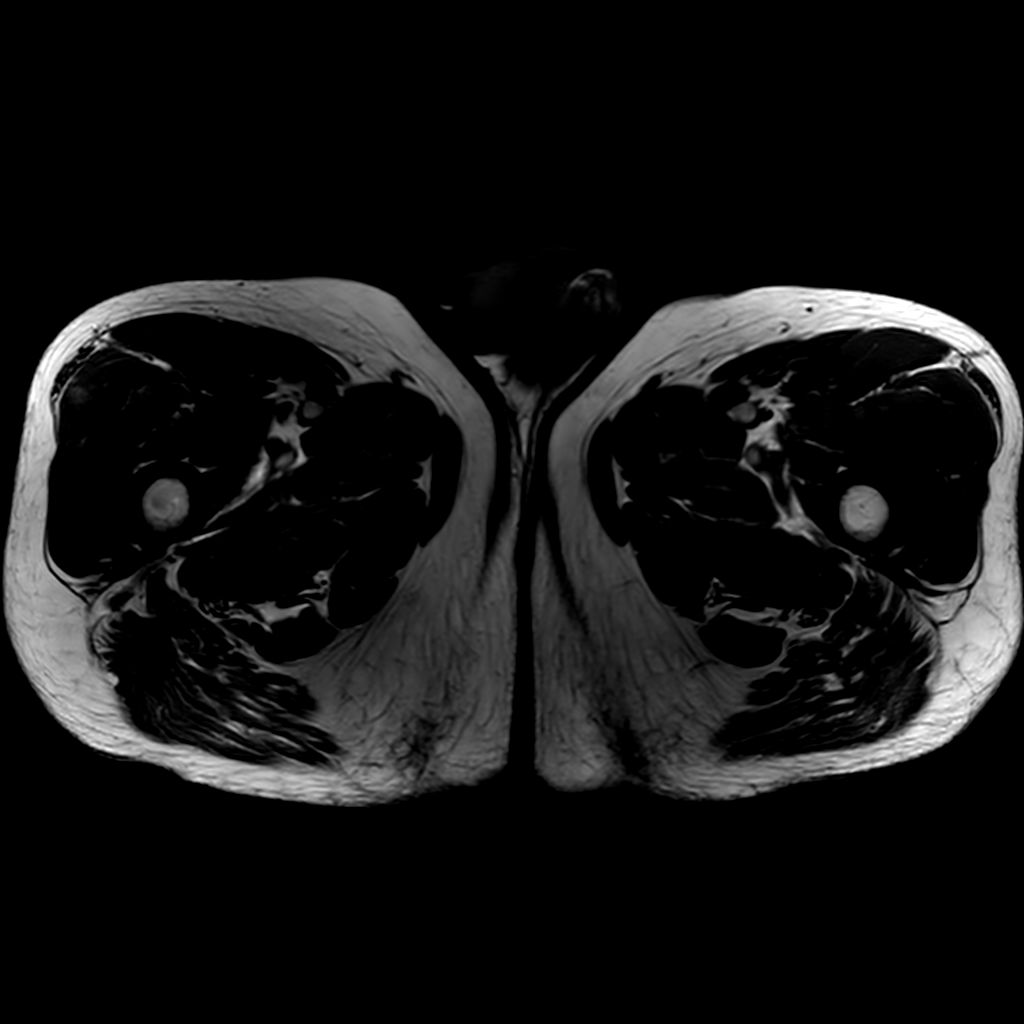

[Series 301: t2w sag · sagittal · 3.0mm · 0.42mm/px · 1 of 30 slices shown]
[im 1/30]
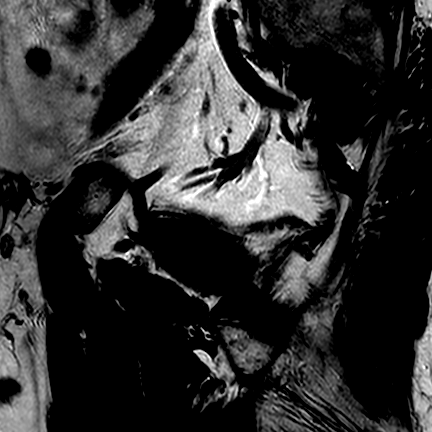

[Series 401: t2w cor · coronal · 3.0mm · 0.42mm/px · 1 of 30 slices shown]
[im 1/30]
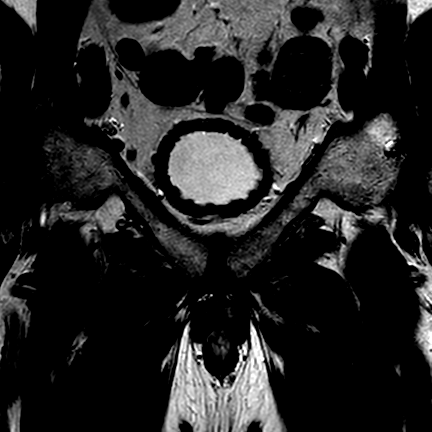

[Series 601: new-dwi_3b* 3mm* · axial · 3.0mm · 1.28mm/px · 1 of 59 slices shown]
[im 1/59]
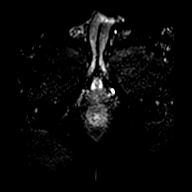

[Series 602: ADC · axial · 3.0mm · 1.28mm/px · 1 of 32 slices shown (1 of 2)]
[im 1/32]
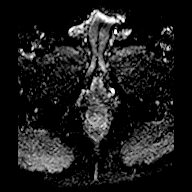

[Series 603: ADC · axial · 3.0mm · 1.28mm/px · 1 of 32 slices shown (2 of 2)]
[im 1/32]
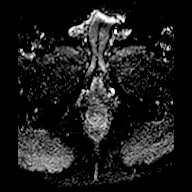

[Series 604: (id) · axial · 3.0mm · 1.28mm/px · 1 of 32 slices shown (1 of 3)]
[im 1/32]
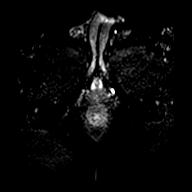

[Series 605: (id) · axial · 3.0mm · 1.28mm/px · 1 of 27 slices shown (2 of 3)]
[im 1/27]
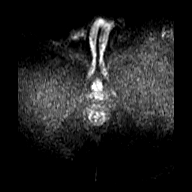

[Series 701: (id) · axial · 3.0mm · 1.28mm/px · 1 of 32 slices shown (3 of 3)]
[im 1/32]
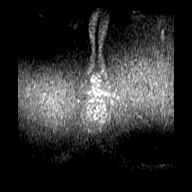

[Series 801: t2w ax · axial · 3.0mm · 0.38mm/px · 1 of 32 slices shown]
[im 1/32]
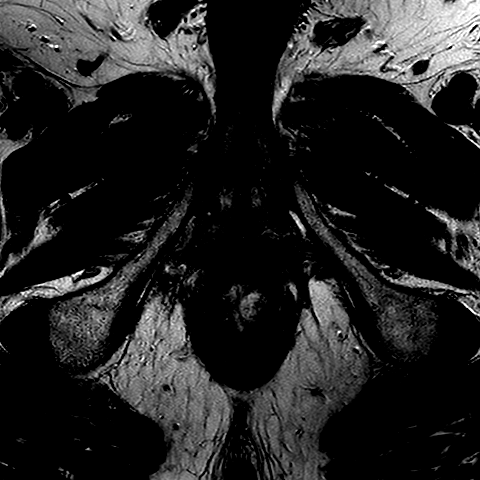

[Series 1001: dyn 3mm*(ap) · axial · 3.0mm · 1.38mm/px · z∈[-111,-18]mm · 36 of 1440 slices shown]
[im 1/1440]
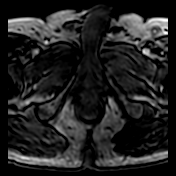
[im 42/1440]
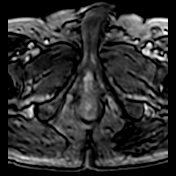
[im 83/1440]
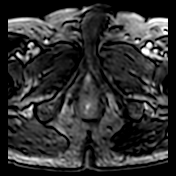
[im 124/1440]
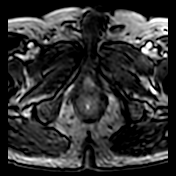
[im 165/1440]
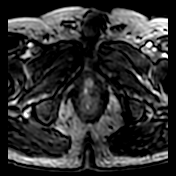
[im 206/1440]
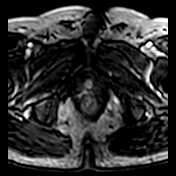
[im 247/1440]
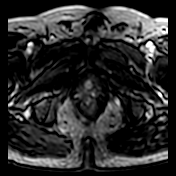
[im 288/1440]
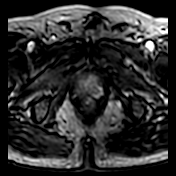
[im 329/1440]
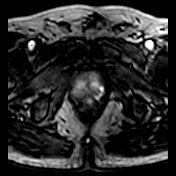
[im 371/1440]
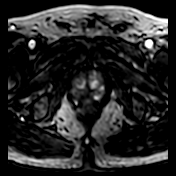
[im 412/1440]
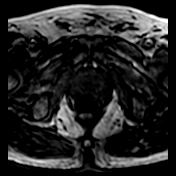
[im 453/1440]
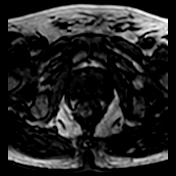
[im 494/1440]
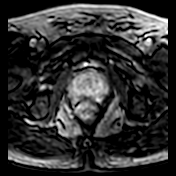
[im 535/1440]
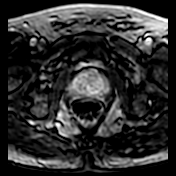
[im 576/1440]
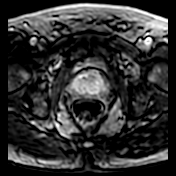
[im 617/1440]
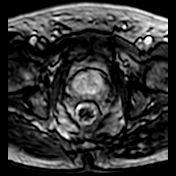
[im 658/1440]
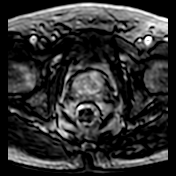
[im 699/1440]
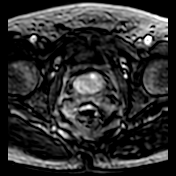
[im 741/1440]
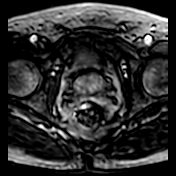
[im 782/1440]
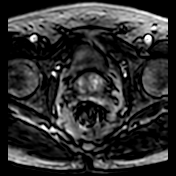
[im 823/1440]
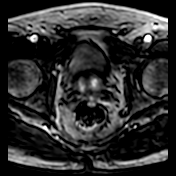
[im 864/1440]
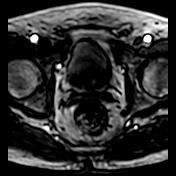
[im 905/1440]
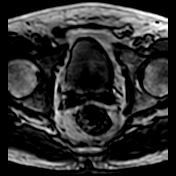
[im 946/1440]
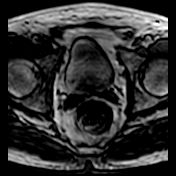
[im 987/1440]
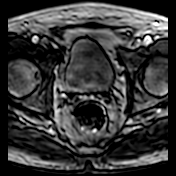
[im 1028/1440]
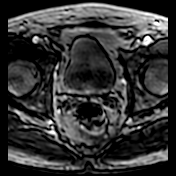
[im 1069/1440]
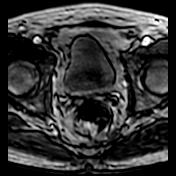
[im 1111/1440]
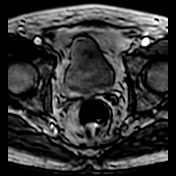
[im 1152/1440]
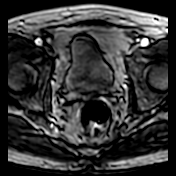
[im 1193/1440]
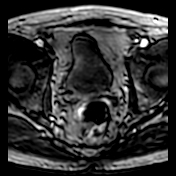
[im 1234/1440]
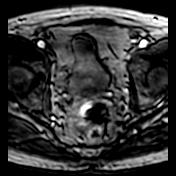
[im 1275/1440]
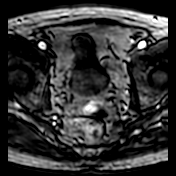
[im 1316/1440]
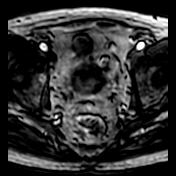
[im 1357/1440]
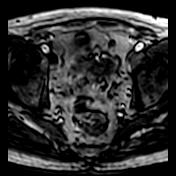
[im 1398/1440]
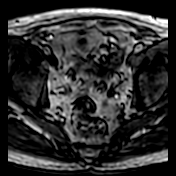
[im 1440/1440]
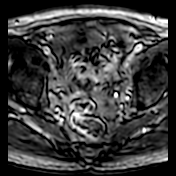

[Series 1102: DIXON · axial · 6.0mm · 0.49mm/px · 1 of 36 slices shown]
[im 1/36]
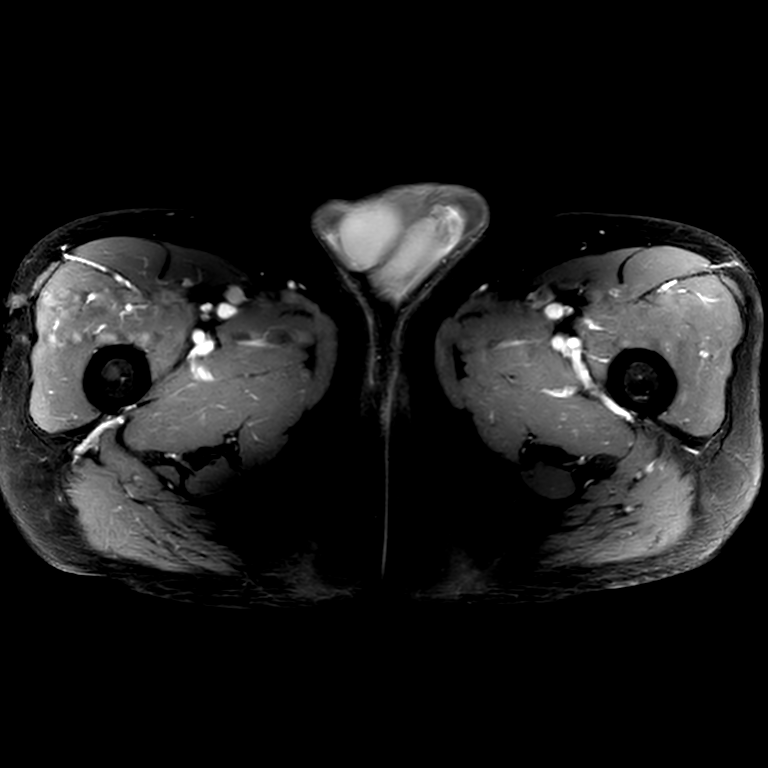

[48 of 48 positions shown; findings below may reference images not displayed]

FINDINGS: VOLUME: 61 cc 
CENTRAL GLAND: Nodular in appearance. No focal lesion suspicious for malignancy. 
Mildly elevates the bladder base. 
PERIPHERAL ZONE: There is an area of decreased T2 signal subcapsular left 
posterior to posterior lateral peripheral zone located mid gland to base level. 
This does show definite decrease signal on ADC with focal area suspicious also 
on diffusion concerning for an underlying peripheral zone malignancy. On ADC 
image 11 measures upwards of 1.4 cm. No suspicious abnormality seen on the 
right. 
PROSTATE CAPSULE: The above described abnormality does abut the capsule without 
extracapsular extension seen. 
MUSCLE SIDE WALLS: Normal in appearance. 
SEMINAL VESICLES: Normal in appearance. 
BLADDER: Trabeculated. Small bladder diverticula identified. 
LYMPHADENOPATHY: No suspicious lymph nodes identified. 
BONES: No osseous lesion seen. 
ADDITIONAL FINDINGS: Diverticulosis.
IMPRESSION: Concerning left peripheral zone lesion mid gland to base level as above. 
(PI-RADS 4): High (clinically significant cancer is likely to be present).

## 2023-09-18 NOTE — Telephone Encounter (Signed)
 Date of Surgery Request: 09/18/2023    Surgery Date: 12/01/2023    Admission Status:  Outpatient     Lead Physician: Dr. Marty    Combined Case:    Pre-Op testing Required: No    Medical Clearance Requested: No    CPT Codes: 44299    ICD-10 Codes: R97.20    Surgery Instructions given to patient:     Surgery Prep:  rectal swab    Scheduled Date of Service:  Date Authorization Requested:  Decision:  Authorization/Reference#:  Name of person spoke with:

## 2023-09-18 NOTE — Telephone Encounter (Signed)
 Patient is returning a call to schedule for a procedure.  Called the back line spoke to [place], call was transferred.

## 2023-10-25 ENCOUNTER — Telehealth: Payer: Self-pay

## 2023-10-25 NOTE — Telephone Encounter (Signed)
 Spoke to patient regarding second opinion. Patient currently under the care of [facility] has a history of elevated PSA and had an MRI PIRADS 4 lesion in [facility] in [place].     Left voice message at radiology facility to request images. Will follow up.     Patient is interested in meeting with Dr. Hale to discuss possibility of getting an MRI Targeted biopsy.      Noah Wells Patient Coordinator, Genitourinary Medical Oncology  [facility]  [facility]   [address],   [place], [place], [address]  T [phone] F [phone]

## 2023-10-30 ENCOUNTER — Other Ambulatory Visit: Payer: Self-pay

## 2023-11-08 NOTE — Telephone Encounter (Signed)
 Patient called returning call, he stated no voicemail left. Do not see TE. Please call pt back to assist. Phone # [phone]

## 2023-11-09 NOTE — Telephone Encounter (Signed)
 Patient is returning a call to the office to schedule for an appt with Dr. Cherise Cornelia.  Called the back line spoke to Erby Hatcher, stated that patient will get a call back

## 2023-11-20 NOTE — Telephone Encounter (Signed)
 Date Auth Started: 11/20/2023      Authorization Number:      Person Spoke With:      Date Clinicals Faxed:      Authorization Status: Pt had Medicare Part A & B. NO Auth Req

## 2023-11-21 ENCOUNTER — Ambulatory Visit
Admission: RE | Admit: 2023-11-21 | Discharge: 2023-11-21 | Disposition: A | Discharge status: Home / Self Care | Payer: Medicare Other | Source: Ambulatory Visit | Attending: Nurse Practitioner | Admitting: Nurse Practitioner

## 2023-11-21 DIAGNOSIS — I10 Essential (primary) hypertension: Secondary | ICD-10-CM | POA: Insufficient documentation

## 2023-11-21 DIAGNOSIS — I712 Thoracic aortic aneurysm, without rupture, unspecified: Secondary | ICD-10-CM | POA: Insufficient documentation

## 2023-11-21 DIAGNOSIS — E7800 Pure hypercholesterolemia, unspecified: Secondary | ICD-10-CM | POA: Insufficient documentation

## 2023-11-22 LAB — ECHO ADULT TTE COMPLETE
AV Area (Cont Eq VTI): 3.7245
AV Mean Gradient: 3
AV Peak Velocity: 1.17
Ao Root Diameter (2D): 4.5
BP Mod LV Ejection Fraction: 61
IVS Diastolic Thickness (2D): 1.34
LA Dimension (2D): 3.6
LA Volume Index (BP A-L): 22.1107
LVID diastole (2D): 5.87
LVID systole (2D): 3.82
MV E/A: 0.888
MV E/e' (Average): 7.5816
Mitral Valve Findings: NORMAL
Prox Ascending Aorta Diameter: 4.0221
Pulmonary Valve Findings: NORMAL
RV Basal Diastolic Dimension: 4.3106
RV Systolic Pressure: 16.1044
TAPSE: 1.88
Tricuspid Valve Findings: NORMAL

## 2023-11-23 ENCOUNTER — Encounter: Payer: Self-pay | Admitting: Urology

## 2023-11-23 ENCOUNTER — Ambulatory Visit: Attending: Urology | Admitting: Urology

## 2023-11-23 VITALS — BP 119/63 | HR 64 | Temp 98.0°F | Ht 74.0 in | Wt 212.0 lb

## 2023-11-23 DIAGNOSIS — R972 Elevated prostate specific antigen [PSA]: Secondary | ICD-10-CM | POA: Insufficient documentation

## 2023-11-23 DIAGNOSIS — Z719 Counseling, unspecified: Secondary | ICD-10-CM | POA: Insufficient documentation

## 2023-11-23 NOTE — Progress Notes (Signed)
 Subjective:      Patient ID: Noah Wells is a 73 y.o. male     Chief Complaint:    73 year-old male referred for elevated PSA    07/14/2021 PSA 3.75  08/06/2022 PSA 4.57  12/19/2022 PSA 5.0  05/16/2023 PSA 6.7  08/11/2023 PSA 6.1    09/05/2023 MRI prostate volume 61 cc left peripheral zone lesion mid gland 1.4 cm PI-RADS 4, trabeculated bladder with small bladder diverticula noted.  No ECE, no LAD -MRI done on floor    PSAD 0.1    PMH - DM, HTN, gout, HLD  PSH-knee meniscus repair  Medications allopurinol, amlodipine , ASA, Lipitor, hydrochlorothiazide, Cozaar, metoprolol  No known drug allergies                              The following portions of the patient's history were reviewed and updated as appropriate: allergies, current medications, past family history, past medical history, past social history, past surgical history and problem list.    Review of Systems  Systems reviewed per the HPI and below:     History obtained from the patient     General ROS: no fevers, or chills     Gastrointestinal ROS: no abdominal pain, change in bowel habits     Musculoskeletal ROS:  no swelling to lower extremities, no back pain     Objective:   BP 119/63   Pulse 64   Temp 98 F (36.7 C) (Oral)   Ht 1.88 m (6\' 2" )   Wt 96.2 kg (212 lb)   SpO2 96%   BMI 27.22 kg/m    vital signs reviewed  Physical Exam   Constitutional: Pt is oriented to person, place, and time and well-developed, well-nourished, and in no distress.   Pulmonary/Chest: Effort normal.       Lab Review   Reviewed results as listed below. Discussed findings with patient.        As above    Radiology Review   Reviewed results as listed below. Discussed results with the patient and answered questions to the best of my ability.     As above    Assessment/plan     1. Elevated PSA        Today we reviewed his PSA results and MRI from January in [place] .  I discussed with him based upon the report that I would recommend a transperineal fusion biopsy.  I did  discuss with him that because the MRI was done elsewhere and I am unable to use it within the [facility] system here.  Difficulty with Dyna CAD processing.  Will plan to proceed with a repeat MRI.  Depending on the results would recommend a transperineal biopsy versus a transperineal fusion biopsy.  I answered all of their questions the best my ability.  Patient is wife expressed understanding of all the above     This note was generated by the Epic EMR system/ Dragon speech recognition and may contain inherent errors or omissions not intended by the user. Grammatical errors, random word insertions, deletions, pronoun errors and incomplete sentences are occasional consequences of this technology due to software limitations. Not all errors are caught or corrected. If there are questions or concerns about the content of this note or information contained within the body of this dictation they should be addressed directly with the author for clarification  I spent 45 total  minutes for the following activities:  Interviewing/examining the patient  Reviewing relevant notes  Reviewing/interpreting/ordering tests and medications  Counseling and educating the patient/family/caregiver with care coordination  Documenting/charting clinical information in EPIC.      There are no Patient Instructions on file for this visit.    Orders  No orders of the defined types were placed in this encounter.      This note was generated by the Epic EMR system/ Dragon speech recognition and may contain inherent errors or omissions not intended by the user. Grammatical errors, random word insertions, deletions, pronoun errors and incomplete sentences are occasional consequences of this technology due to software limitations. Not all errors are caught or corrected. If there are questions or concerns about the content of this note or information contained within the body of this dictation they should be addressed directly with the author for  clarification

## 2023-11-27 ENCOUNTER — Ambulatory Visit (INDEPENDENT_AMBULATORY_CARE_PROVIDER_SITE_OTHER): Admitting: Nurse Practitioner

## 2023-11-28 ENCOUNTER — Ambulatory Visit (INDEPENDENT_AMBULATORY_CARE_PROVIDER_SITE_OTHER)

## 2023-11-29 ENCOUNTER — Telehealth: Payer: Self-pay | Admitting: Urology

## 2023-11-29 ENCOUNTER — Ambulatory Visit (INDEPENDENT_AMBULATORY_CARE_PROVIDER_SITE_OTHER): Payer: Medicare Other | Admitting: Nurse Practitioner

## 2023-11-29 ENCOUNTER — Encounter (INDEPENDENT_AMBULATORY_CARE_PROVIDER_SITE_OTHER): Payer: Self-pay | Admitting: Nurse Practitioner

## 2023-11-29 DIAGNOSIS — R972 Elevated prostate specific antigen [PSA]: Secondary | ICD-10-CM

## 2023-11-29 NOTE — Telephone Encounter (Signed)
 Orders entered for MRI Prostate. Reaching out to [facility].

## 2023-11-30 ENCOUNTER — Telehealth: Payer: Self-pay | Admitting: Urology

## 2023-12-05 NOTE — Progress Notes (Signed)
 Called pt to then transfer to [facility] for scheduling

## 2023-12-05 NOTE — Progress Notes (Signed)
 Called cardiac connect representative who stated she will call patient to reschedule appointment.

## 2023-12-08 ENCOUNTER — Other Ambulatory Visit: Payer: Self-pay | Admitting: Urology

## 2023-12-08 ENCOUNTER — Ambulatory Visit
Admission: RE | Admit: 2023-12-08 | Discharge: 2023-12-08 | Disposition: A | Discharge status: Home / Self Care | Source: Ambulatory Visit | Attending: Urology | Admitting: Urology

## 2023-12-08 DIAGNOSIS — R972 Elevated prostate specific antigen [PSA]: Secondary | ICD-10-CM | POA: Insufficient documentation

## 2023-12-08 MED ORDER — GADOBUTROL 1 MMOL/ML IV SOSY (WRAP)
10.0000 mL | Freq: Once | INTRAVENOUS | Status: AC | PRN
Start: 2023-12-08 — End: 2023-12-08
  Administered 2023-12-08: 10 mL via INTRAVENOUS
  Filled 2023-12-08: qty 10

## 2023-12-21 ENCOUNTER — Ambulatory Visit: Attending: Urology | Admitting: Urology

## 2023-12-21 DIAGNOSIS — R972 Elevated prostate specific antigen [PSA]: Secondary | ICD-10-CM

## 2023-12-21 DIAGNOSIS — R935 Abnormal findings on diagnostic imaging of other abdominal regions, including retroperitoneum: Secondary | ICD-10-CM

## 2023-12-21 NOTE — Progress Notes (Signed)
 Subjective:      Patient ID: Noah Wells is a 73 y.o. male     Chief Complaint:  Verbal consent has been obtained from the patient to conduct a video and telephone visit to minimize exposure to COVID-63         73 year-old male referred for elevated PSA     07/14/2021 PSA 3.75  08/06/2022 PSA 4.57  12/19/2022 PSA 5.0  05/16/2023 PSA 6.7  08/11/2023 PSA 6.1     09/05/2023 MRI prostate volume 61 cc left peripheral zone lesion mid gland 1.4 cm PI-RADS 4, trabeculated bladder with small bladder diverticula noted.  No ECE, no LAD -MRI done on floor     PSAD 0.1      12/08/2023 MRI VOL 56cc, Lesion 1 left pz apex PIRAD 4     PMH - DM, HTN, gout, HLD  PSH-knee meniscus repair  Medications allopurinol, amlodipine , ASA, Lipitor, hydrochlorothiazide, Cozaar, metoprolol  No known drug allergies                          The following portions of the patient's history were reviewed and updated as appropriate: allergies, current medications, past family history, past medical history, past social history, past surgical history and problem list.    Review of Systems  Systems reviewed per the HPI and below:     History obtained from the patient     General ROS: no fevers, or chills     Gastrointestinal ROS: no abdominal pain, change in bowel habits     Musculoskeletal ROS:  no swelling to lower extremities, no back pain     Objective:   TELEMEDICINE VISIT    Physical Exam   Constitutional:  Well-developed, well-nourished, and in no distress.  Neurological: Pt is alert and oriented to person, place, and time.    Psychiatric: Mood, memory, affect and judgment normal.       Lab Review   Reviewed results as listed below. Discussed findings with patient.         Radiology Review   Reviewed results as listed below. Discussed results with the patient and answered questions to the best of my ability.     ng Physician Reading Date Result Priority   Herlene Alfredo BRAVO, MD  [phone]  12/08/2023      Narrative & Impression  [place]  [facility]  [facility]     BARTLOMIEJ, JENKINSON                               EXAM PERFORMED AT:  [id number]       DOB:December 13, 1950                 [address] [facility] [address]  12/08/2023     AGE:8   Gender:M              Physicians Only: [phone]                QUINCE LITTIE NEER MD                                  [F]       [address] [place], [place] [address]           MRI PROSTATE  WITHOUT AND WITH CONTRAST     HISTORY: Elevated prostate specific antigen     COMPARISON: None available.     TECHNIQUE: MR imaging of the pelvis (prostate protocol) was performed with  and without  10 mL mL of Gadavist  intravenous contrast. Multiparametric  prostate MRI, processing performed on an independent workstation.     FINDINGS: The prostate measures 5.5 x 3.9 x 5.0 cm, volume 56 cc.     Hemorrhage: None     PERIPHERAL ZONE: There is a focus of restricted diffusion in the left  posterior peripheral zone at the apex     Lesion 1:  Location: In the left posterior peripheral zone at the apex  Size: 1.3 cm  Representative images: 5:23, 750:20 and 700:20  Relation to capsule: Makes contact with the capsule without extending  beyond it.  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 No suspicious lesion is  identified     Seminal vesicles: Within normal limits.     Neurovascular bundles: Within normal limits.     Lymph nodes: no enlarged nodes.     Bladder: within normal limits.     Bone marrow: No suspicious bone lesion confirmed.     Other: Sigmoid colon diverticulosis.     Suspicious lesion(s) were processed in DynaCAD if MR/US  fusion biopsy is  planned.         IMPRESSION:      1.  There is a PIRADS 4 lesion in the left posterior peripheral zone at  the apex.  2.  Prostate volume 56 cc.  3.  No evidence for extracapsular  extension.  4.  Overall PI-RADS score: 4.     PI-RADS SCALE version 2.1:  1: Very low (clinically significant cancer is highly unlikely to be  present)  2: Low (clinically significant cancer is unlikely to be present)  3: Indeterminate (the presence of clinically significant cancer is  equivocal)  4: High (clinically significant cancer is likely to be present)  5: Very high (clinically significant cancer is highly likely to be present)  X: (component of exam technically inadequate or not performed)        Electronically signed by: Franklyn E Luke M.D.   RADIOLOGICAL CONSULT      Assessment/plan     1. Elevated PSA    2. Abnormal MRI, pelvis      Reviewed MRI results  Recommend mri targeted fusion bx  Risk/benefit reviewed  They would like it scheduled in May due to grandbaby due  Patient expressed understanding of all the above.   All questions were answered to the best of my ability.           Plan:     Patient Instructions   We will schedule you for a Transperineal Prostate Biopsy at the [facility], [address] [place] [address]    You will receive an antibiotic at the time of the procedure.    If you are on anticoagulation please discontinue this 5 to 7 days before the biopsy. Please check with your primary care doctor to ensure that is this is okay.    Please stop aspirin and non steroidal anti inflammatory medications (Ibuprophen, Motrin, Advil, Naprosyn etc) 7 days before the biopsy.  Transperineal MRI Targeted Prostate Biopsy             Cancer occurs when abnormal cells form a tumor. A tumor is a lump of cells that grows uncontrolled. Early tests that may indicate cancer of the prostate include a digital rectal exam, a PSA (prostate specific antigen blood test), ultrasound, and others. A core needle biopsy will be done if your healthcare provider thinks you have prostate cancer. A thin needle is used to remove small samples of prostate  tissue. Often a few biopsies are taken. These samples are checked for cancer.    Taking tissue samples  A biopsy takes about 15 to 20 minutes.  Antibiotics are given at least an hour before the biopsy. During the procedure:  You will be given antibiotics to prevent infection.  You may be given a sedative, local pain killer, or pain medicine.  A small ultrasound probe is put into the rectum as you lie on your side. A picture of your prostate can then be seen on a screen. This is called a transrectal ultrasound (TRUS).  Your healthcare provider will use the TRUS picture as a guide. He or she will use a thin needle to remove tiny tissue samples from some sites in the prostate.  If you had a MRI that demonstrated areas in the prostate gland that was suspicious for cancer the doctor will use the images from your MRI and fuse/merge them to the ultrasound to guide where samples should be taken. These are called targeted biopsies.  If you did not have a MRI or there were no abnormal areas on the MRI the doctor will take the stand systematic biopsy samples were prostate cancer is often found.  These tissue samples are sent to the pathology department. They are looked at under a microscope so a diagnosis can be made.   Risks and possible complications of core needle biopsy  Infection  Blood in urine, stool, or semen  Pain  The biopsy misses the tumor     Home care  You may have had the biopsy done through your rectum, your urethra, or through the skin between your scrotum and rectum. Your healthcare provider will tell you what to do after the biopsy. These instructions are based on your health condition, the type of biopsy, and your provider's practices.  Your provider may give you pain medicine such as acetaminophen or ibuprofen for discomfort or pain. Follow your provider's instructions for taking these medicines. Don't take aspirin or ibuprofen after the procedure. If you have an ongoing (chronic) liver or kidney disease,  talk with your provider before taking these medicines. Also talk with your provider if you've had a stomach ulcer or GI (gastrointestinal) bleeding. Let your provider know all the medicines you currently take.  You may need to take antibiotics for 1 to 2 days. This will help prevent an infection. Signs of an infection include chills, pain, or fever.  You may be told to drink 8 ounces of water every 30 minutes for 2 hours. This will help ease any discomfort. You can also take a warm bath. Or you can put a warm, damp washcloth over your urethra to help ease the pain.  You may see minor bleeding after the procedure. This is normal and often needs no treatment. You may see blood in your urine or semen (rust color). This may last for 1 to 2 months. You may also have light bleeding from your rectum if you have hemorrhoids. This  can last up to 7- to 14 days.  When you urinate you may go more often, feel burning, and see pink-tinged urine for up to 7 to 14 days after the biopsy.  Your provider will tell you when you can go back to your normal activities. This includes sex, exercise, and straining physically. Discuss these with your provider.  When to seek medical advice  Call your healthcare provider right away if any of these occur:  You aren't able to urinate, with or without your bladder feeling full, or see that you have less flow of urine  Chills  Fever of 100.35F (38C) or higher, or as directed by your provider  Severe pain  Signs of an infection that is getting worse. These include worsening pain, pain in your side under the rib cage or in the low back, or bad-smelling urine.  Blood clots or bright red blood in your stool or urine  You feel confused or very tired  Your lower belly feels firm over your bladder area      Orders  No orders of the defined types were placed in this encounter.      This note was generated by the Epic EMR system/ Dragon speech recognition and may contain inherent errors or omissions not  intended by the user. Grammatical errors, random word insertions, deletions, pronoun errors and incomplete sentences are occasional consequences of this technology due to software limitations. Not all errors are caught or corrected. If there are questions or concerns about the content of this note or information contained within the body of this dictation they should be addressed directly with the author for clarification

## 2023-12-21 NOTE — Patient Instructions (Signed)
We will schedule you for a Transperineal Prostate Biopsy at the [facility], [address], [place] [address]    You will receive an antibiotic at the time of the procedure.    If you are on anticoagulation please discontinue this 5 to 7 days before the biopsy. Please check with your primary care doctor to ensure that is this is okay.    Please stop aspirin and non steroidal anti inflammatory medications (Ibuprophen, Motrin, Advil, Naprosyn etc) 7 days before the biopsy.       Transperineal MRI Targeted Prostate Biopsy             Cancer occurs when abnormal cells form a tumor. A tumor is a lump of cells that grows uncontrolled. Early tests that may indicate cancer of the prostate include a digital rectal exam, a PSA (prostate specific antigen blood test), ultrasound, and others. A core needle biopsy will be done if your healthcare provider thinks you have prostate cancer. A thin needle is used to remove small samples of prostate tissue. Often a few biopsies are taken. These samples are checked for cancer.    Taking tissue samples  A biopsy takes about 15 to 20 minutes.  Antibiotics are given at least an hour before the biopsy. During the procedure:  You will be given antibiotics to prevent infection.  You may be given a sedative, local pain killer, or pain medicine.  A small ultrasound probe is put into the rectum as you lie on your side. A picture of your prostate can then be seen on a screen. This is called a transrectal ultrasound (TRUS).  Your healthcare provider will use the TRUS picture as a guide. He or she will use a thin needle to remove tiny tissue samples from some sites in the prostate.  If you had a MRI that demonstrated areas in the prostate gland that was suspicious for cancer the doctor will use the images from your MRI and fuse/merge them to the ultrasound to guide where samples should be taken. These are called targeted biopsies.  If you did not  have a MRI or there were no abnormal areas on the MRI the doctor will take the stand systematic biopsy samples were prostate cancer is often found.  These tissue samples are sent to the pathology department. They are looked at under a microscope so a diagnosis can be made.   Risks and possible complications of core needle biopsy  Infection  Blood in urine, stool, or semen  Pain  The biopsy misses the tumor     Home care  You may have had the biopsy done through your rectum, your urethra, or through the skin between your scrotum and rectum. Your healthcare provider will tell you what to do after the biopsy. These instructions are based on your health condition, the type of biopsy, and your provider's practices.  Your provider may give you pain medicine such as acetaminophen or ibuprofen for discomfort or pain. Follow your provider's instructions for taking these medicines. Don't take aspirin or ibuprofen after the procedure. If you have an ongoing (chronic) liver or kidney disease, talk with your provider before taking these medicines. Also talk with your provider if you've had a stomach ulcer or GI (gastrointestinal) bleeding. Let your provider know all the medicines you currently take.  You may need to take antibiotics for 1 to 2 days. This will help prevent an infection. Signs of an infection include chills, pain,   or fever.  You may be told to drink 8 ounces of water every 30 minutes for 2 hours. This will help ease any discomfort. You can also take a warm bath. Or you can put a warm, damp washcloth over your urethra to help ease the pain.  You may see minor bleeding after the procedure. This is normal and often needs no treatment. You may see blood in your urine or semen (rust color). This may last for 1 to 2 months. You may also have light bleeding from your rectum if you have hemorrhoids. This can last up to 7- to 14 days.  When you urinate you may go more often, feel burning, and see pink-tinged urine for up  to 7 to 14 days after the biopsy.  Your provider will tell you when you can go back to your normal activities. This includes sex, exercise, and straining physically. Discuss these with your provider.  When to seek medical advice  Call your healthcare provider right away if any of these occur:  You aren't able to urinate, with or without your bladder feeling full, or see that you have less flow of urine  Chills  Fever of 100.4F (38C) or higher, or as directed by your provider  Severe pain  Signs of an infection that is getting worse. These include worsening pain, pain in your side under the rib cage or in the low back, or bad-smelling urine.  Blood clots or bright red blood in your stool or urine  You feel confused or very tired  Your lower belly feels firm over your bladder area

## 2023-12-22 ENCOUNTER — Encounter (INDEPENDENT_AMBULATORY_CARE_PROVIDER_SITE_OTHER): Payer: Self-pay

## 2023-12-28 ENCOUNTER — Encounter (INDEPENDENT_AMBULATORY_CARE_PROVIDER_SITE_OTHER): Payer: Self-pay

## 2024-01-01 ENCOUNTER — Telehealth (INDEPENDENT_AMBULATORY_CARE_PROVIDER_SITE_OTHER): Payer: Self-pay

## 2024-01-01 NOTE — Telephone Encounter (Signed)
 Called pt for R.N interview. Pt said he is driving requested to be called back

## 2024-01-02 ENCOUNTER — Encounter (INDEPENDENT_AMBULATORY_CARE_PROVIDER_SITE_OTHER): Payer: Self-pay

## 2024-01-02 ENCOUNTER — Ambulatory Visit (INDEPENDENT_AMBULATORY_CARE_PROVIDER_SITE_OTHER)

## 2024-01-02 NOTE — Nursing Progress Note (Addendum)
 PSS RN Note:    Per interview RN, pt's LD of Synjardy was 01/01/24 PM.     Per anesthesiologist Dr. Jayson Ruth, procedure cannot be done with anesthesia. If the surgeon would like to proceed under local, then that is their choice but unlikely that the [facility] staff will support this.     Secure chat sent to Dr. Hale and surgical scheduler Signe to notify of the above.Outgoing call to Dr. Julien office x2. LVM for surgical scheduler Signe advising of the above and requesting c/b. Spoke with uro RN Mindie who states she will send an urgent message to [facility].    Addendum 5/20 @ 1520: per pt and surgical scheduler, procedure to be rescheduled to 01/17/24. Pt aware to hold Synjardy x72 hrs prior to new DOS. Pt to refer to med instructions per interview RN in AVS.

## 2024-01-02 NOTE — PSS Phone Screening (Signed)
 Pre-Anesthesia Evaluation    Pre-op phone visit requested by:   Reason for pre-op phone visit: Patient anticipating BIOPSY, PROSTATE, TRANSRECTAL ULTRASOUND (TRUS), INTEGRATED MAGNETIC RESONANCE IMAGING (MRI) procedure.     Day before phone interview completed with patient    Patient is on Synjardy, per anesthesia guidelines need to hold for 72 hours prior to anesthesia.  Patient unaware to hold for 72 hours, last dose 01/01/24.  Sent a secure chat to anesthesia and surgeon to verify if ok to proceed as case is tomorrow 01/03/24. PSSNav made aware.    No orders of the defined types were placed in this encounter.      History of Present Illness/Summary:        Problem List:  Medical Problems       Hospital Problem List  Date Reviewed: 01/03/2023   None        Non-Hospital Problem List  Date Reviewed: 01/03/2023          ICD-10-CM Priority Class Noted Diagnosed    Diabetes mellitus (CMS/HCC) E11.9   Unknown     Hypertension I10   Unknown     Hyperlipidemia E78.5   Unknown     Thoracic aortic aneurysm without rupture, unspecified part I71.20   01/03/2023         Medical History   Diagnosis Date    Diabetes mellitus (CMS/HCC)     glucose avg 150's    Elevated PSA     Gout     Hand fracture     Hyperlipidemia     Hypertension     RBBB      Past Surgical History[1]     Medication List            Accurate as of Jan 02, 2024 11:43 AM. Always use your most recent med list.                allopurinol 300 MG tablet  Take 1 tablet (300 mg) by mouth daily  Commonly known as: ZYLOPRIM  Medication Adjustments for Surgery: Take morning of surgery     amLODIPine  5 MG tablet  Take 1 tablet (5 mg) by mouth daily  Commonly known as: NORVASC   Medication Adjustments for Surgery: Take morning of surgery     aspirin EC 81 MG EC tablet  Take 1 tablet (81 mg) by mouth daily  Medication Adjustments for Surgery: Stop 7 days before surgery     atorvastatin  20 MG tablet  Take 1 tablet (20 mg) by mouth daily  Commonly known as: Lipitor  Medication  Adjustments for Surgery: Take morning of surgery     glipiZIDE 10 MG 24 hr tablet  Take 1 tablet (10 mg) by mouth 2 (two) times daily  Commonly known as: GLUCOTROL  Medication Adjustments for Surgery: Hold day of surgery     hydroCHLOROthiazide 25 MG tablet  Take 1 tablet (25 mg) by mouth daily  Commonly known as: HYDRODIURIL  Medication Adjustments for Surgery: Take morning of surgery     losartan 50 MG tablet  Commonly known as: COZAAR  Medication Adjustments for Surgery: Take morning of surgery     metoprolol succinate 100 MG 24 hr tablet  Take 1 tablet (100 mg) by mouth 2 (two) times daily  Commonly known as: TOPROL-XL  Medication Adjustments for Surgery: Take morning of surgery     Synjardy XR 12.12-998 MG Tb24  Take 1 tablet by mouth once nightly  Generic drug: Empagliflozin-metFORMIN HCl ER  Medication Adjustments  for Surgery: Stop 3 days before surgery            Allergies[2]  Family History[3]  Social History     Occupational History    Not on file   Tobacco Use    Smoking status: Never    Smokeless tobacco: Never   Vaping Use    Vaping status: Never Used   Substance and Sexual Activity    Alcohol use: Yes     Alcohol/week: 14.0 standard drinks of alcohol     Types: 14 Glasses of wine per week     Comment: 2 daily    Drug use: No    Sexual activity: Not on file           Exam Scores:   SDB score           STBUR score       PONV score  Nausea Risk: MODERATE RISK    MST score  MST Score: 0    PEN-FAST score  PEN-FAST Score: 1    Frailty score  CFS Score: 3    CHADsVasc            Visit Vitals  Ht 1.88 m (6' 2)   Wt 96.6 kg (213 lb)   BMI 27.35 kg/m   Overweight based on BMI.                                     [1]   Past Surgical History:  Procedure Laterality Date    ARTHROSCOPY KNEE, MENISCUS REPAIR      COLONOSCOPY, DIAGNOSTIC (SCREENING)      EGD     [2]   Allergies  Allergen Reactions    Penicillins Other (See Comments)     Junior year in college-1974, unknown reaction   [3]   Family History  Problem  Relation Name Age of Onset    Lymphoma Father  9

## 2024-01-03 ENCOUNTER — Encounter: Admission: RE | Payer: Self-pay | Source: Ambulatory Visit

## 2024-01-03 ENCOUNTER — Ambulatory Visit: Admission: RE | Admit: 2024-01-03 | Source: Ambulatory Visit | Admitting: Urology

## 2024-01-03 SURGERY — BIOPSY, PROSTATE, TRANSRECTAL ULTRASOUND (TRUS), INTEGRATED MAGNETIC RESONANCE IMAGING (MRI)
Anesthesia: Anesthesia Choice

## 2024-01-11 ENCOUNTER — Telehealth (INDEPENDENT_AMBULATORY_CARE_PROVIDER_SITE_OTHER): Payer: Self-pay

## 2024-01-11 NOTE — Telephone Encounter (Signed)
 PEC Nav note: Courtesy call placed to pt confirming 3 day hold for Synjardy prior to 01/17/24 DOS, with last dose 01/13/24.  Pt confirms understanding and declined further medication/fasting reminders.

## 2024-01-16 NOTE — H&P (Signed)
 ADMISSION HISTORY AND PHYSICAL EXAM    Date Time: 01/16/24 8:18 PM  Patient Name: Noah Wells  Attending Physician: Hale Quince CROME, MD    Assessment:   Mri bx    Plan:   Mri bx    History of Present Illness:   Noah Wells is a 73 y.o. male who presents to the hospital with  elevated PSA     07/14/2021 PSA 3.75  08/06/2022 PSA 4.57  12/19/2022 PSA 5.0  05/16/2023 PSA 6.7  08/11/2023 PSA 6.1     09/05/2023 MRI prostate volume 61 cc left peripheral zone lesion mid gland 1.4 cm PI-RADS 4, trabeculated bladder with small bladder diverticula noted.  No ECE, no LAD -MRI done on floor     PSAD 0.1        12/08/2023 MRI VOL 56cc, Lesion 1 left pz apex PIRAD 4     PMH - DM, HTN, gout, HLD  PSH-knee meniscus repair  Medications allopurinol, amlodipine , ASA, Lipitor, hydrochlorothiazide, Cozaar, metoprolol  No known drug allergies        Past Medical History:     Past Medical History:   Diagnosis Date    Diabetes mellitus (CMS/HCC)     glucose avg 150's    Elevated PSA     Gout     Hand fracture     Hyperlipidemia     Hypertension     RBBB        Past Surgical History:   Past Surgical History[1]    Family History:   Family History[2]    Social History:   Social History[3]    Allergies:   Allergies[4]    Medications:     No medications prior to admission.       Review of Systems:     Systems reviewed per the HPI and below:  History obtained from the patient  General ROS: Pt otherwise feeling well, no recent illness.    Ophthalmic MND:izwpzd blurry vision or yellowing of the eyes          Allergy and Immunology ROS: known/unknown allergies as  described by the patient      Hematological and Lymphatic ROS: No known bleeding/clotting disorders                        Endocrine ROS: no significant hot/cold spells  Respiratory ROS: no cough, shortness of breath, or wheezing  Cardiovascular ROS: no chest pain or dyspnea on exertion  Gastrointestinal ROS: no abdominal pain, change in bowel habits  Genito-Urinary ROS: no  dysuria, trouble voiding, or hematuria  Musculoskeletal ROS:  no swelling to lower extremities, no back pain                                Neurological ROS: no focal weakness  Dermatological ROS: no new rashes or lesions      Physical Exam:   There were no vitals filed for this visit.    Intake and Output Summary (Last 24 hours) at Date Time  No intake or output data in the 24 hours ending 01/16/24 2018    Physical Exam   Constitutional:    Appears well-developed and well-nourished.    Head: Normocephalic.   Neck: Normal range of motion.   Cardiovascular: Normal rate and regular rhythm.    Pulmonary/Chest: Effort normal and breath sounds normal.   Abdominal: Soft. Bowel sounds are normal.  Neurological: Alert and oriented to person, place, and time.       Labs:            Rads:   Radiological Procedure reviewed.    No results found.    Signed by: Quince LITTIE Neer, MD                  [1]   Past Surgical History:  Procedure Laterality Date    ARTHROSCOPY KNEE, MENISCUS REPAIR      COLONOSCOPY, DIAGNOSTIC (SCREENING)      EGD     [2]   Family History  Problem Relation Name Age of Onset    Lymphoma Father  97   [3]   Social History  Socioeconomic History    Marital status: Married   Tobacco Use    Smoking status: Never    Smokeless tobacco: Never   Vaping Use    Vaping status: Never Used   Substance and Sexual Activity    Alcohol use: Yes     Alcohol/week: 14.0 standard drinks of alcohol     Types: 14 Glasses of wine per week     Comment: 2 daily    Drug use: No     Social Drivers of Psychologist, prison and probation services Strain: Low Risk  (01/15/2024)    Overall Financial Resource Strain (CARDIA)     Difficulty of Paying Living Expenses: Not hard at all   Food Insecurity: No Food Insecurity (01/15/2024)    Hunger Vital Sign     Worried About Running Out of Food in the Last Year: Never true     Ran Out of Food in the Last Year: Never true   Transportation Needs: No Transportation Needs (01/15/2024)    PRAPARE - Nutritional therapist (Medical): No     Lack of Transportation (Non-Medical): No   Physical Activity: Sufficiently Active (01/15/2024)    Exercise Vital Sign     Days of Exercise per Week: 5 days     Minutes of Exercise per Session: 50 min   Stress: No Stress Concern Present (01/15/2024)    [employer] of Occupational Health - Occupational Stress Questionnaire     Feeling of Stress : Only a little   Social Connections: Moderately Integrated (01/15/2024)    Social Connection and Isolation Panel [NHANES]     Frequency of Communication with Friends and Family: More than three times a week     Frequency of Social Gatherings with Friends and Family: More than three times a week     Attends Religious Services: 1 to 4 times per year     Active Member of [employer] or Organizations: No     Attends Banker Meetings: Patient declined     Marital Status: Married   Catering manager Violence: Not At Risk (01/15/2024)    Humiliation, Afraid, Rape, and Kick questionnaire     Fear of Current or Ex-Partner: No     Emotionally Abused: No     Physically Abused: No     Sexually Abused: No   Housing Stability: Low Risk  (01/15/2024)    Housing Stability Vital Sign     Unable to Pay for Housing in the Last Year: No     Number of Times Moved in the Last Year: 1     Homeless in the Last Year: No   [4]   Allergies  Allergen Reactions    Penicillins Other (See  Comments)     Junior year in college-1974, unknown reaction

## 2024-01-17 ENCOUNTER — Ambulatory Visit: Admitting: Residents

## 2024-01-17 ENCOUNTER — Encounter: Payer: Self-pay | Admitting: Urology

## 2024-01-17 ENCOUNTER — Encounter
Admission: RE | Disposition: A | Discharge status: Home / Self Care | Payer: Self-pay | Source: Ambulatory Visit | Attending: Urology

## 2024-01-17 ENCOUNTER — Ambulatory Visit
Admission: RE | Admit: 2024-01-17 | Discharge: 2024-01-17 | Disposition: A | Discharge status: Home / Self Care | Source: Ambulatory Visit | Attending: Urology | Admitting: Urology

## 2024-01-17 DIAGNOSIS — R935 Abnormal findings on diagnostic imaging of other abdominal regions, including retroperitoneum: Secondary | ICD-10-CM

## 2024-01-17 DIAGNOSIS — R972 Elevated prostate specific antigen [PSA]: Secondary | ICD-10-CM

## 2024-01-17 DIAGNOSIS — C61 Malignant neoplasm of prostate: Secondary | ICD-10-CM | POA: Insufficient documentation

## 2024-01-17 DIAGNOSIS — I712 Thoracic aortic aneurysm, without rupture, unspecified: Secondary | ICD-10-CM | POA: Insufficient documentation

## 2024-01-17 DIAGNOSIS — I10 Essential (primary) hypertension: Secondary | ICD-10-CM | POA: Insufficient documentation

## 2024-01-17 HISTORY — PX: BIOPSY, PROSTATE, TRANSRECTAL ULTRASOUND (TRUS), INTEGRATED MAGNETIC RESONANCE IMAGING (MRI): SHX6143

## 2024-01-17 LAB — WHOLE BLOOD GLUCOSE POCT: Whole Blood Glucose POCT: 249 mg/dL — ABNORMAL HIGH (ref 70–100)

## 2024-01-17 SURGERY — BIOPSY, PROSTATE, TRANSRECTAL ULTRASOUND (TRUS), INTEGRATED MAGNETIC RESONANCE IMAGING (MRI)
Anesthesia: Anesthesia General | Wound class: Clean Contaminated

## 2024-01-17 MED ORDER — OXYCODONE HCL 5 MG PO TABS
5.0000 mg | ORAL_TABLET | Freq: Once | ORAL | Status: DC | PRN
Start: 2024-01-17 — End: 2024-01-17

## 2024-01-17 MED ORDER — LACTATED RINGERS IV SOLN
INTRAVENOUS | Status: AC
Start: 2024-01-17 — End: 2024-01-17
  Filled 2024-01-17: qty 1000

## 2024-01-17 MED ORDER — LIDOCAINE HCL 1 % IJ SOLN
INTRAMUSCULAR | Status: AC
Start: 2024-01-17 — End: 2024-01-17
  Administered 2024-01-17: 7 mL
  Filled 2024-01-17: qty 10

## 2024-01-17 MED ORDER — HYDRALAZINE HCL 20 MG/ML IJ SOLN
10.0000 mg | Freq: Once | INTRAMUSCULAR | Status: DC
Start: 2024-01-17 — End: 2024-01-17

## 2024-01-17 MED ORDER — ONDANSETRON HCL 4 MG/2ML IJ SOLN
4.0000 mg | Freq: Once | INTRAMUSCULAR | Status: DC | PRN
Start: 2024-01-17 — End: 2024-01-17

## 2024-01-17 MED ORDER — ACETAMINOPHEN 500 MG PO TABS
1000.0000 mg | ORAL_TABLET | Freq: Once | ORAL | Status: DC | PRN
Start: 2024-01-17 — End: 2024-01-17

## 2024-01-17 MED ORDER — KETOROLAC TROMETHAMINE 30 MG/ML IJ SOLN
INTRAMUSCULAR | Status: DC | PRN
Start: 2024-01-17 — End: 2024-01-17
  Administered 2024-01-17: 30 mg via INTRAVENOUS

## 2024-01-17 MED ORDER — CLINDAMYCIN PHOSPHATE IN D5W 600 MG/50ML IV SOLN
600.0000 mg | Freq: Once | INTRAVENOUS | Status: AC
Start: 2024-01-17 — End: 2024-01-17
  Administered 2024-01-17: 600 mg via INTRAVENOUS
  Filled 2024-01-17: qty 50

## 2024-01-17 MED ORDER — PROPOFOL INFUSION 10 MG/ML
INTRAVENOUS | Status: DC | PRN
Start: 2024-01-17 — End: 2024-01-17
  Administered 2024-01-17: 150 mg via INTRAVENOUS
  Administered 2024-01-17: 50 mg via INTRAVENOUS

## 2024-01-17 SURGICAL SUPPLY — 15 items
APPLICATOR CHLORAPREP 10.5 ML ISOPROPYL ALCOHOL CHLORHEXIDINE (Applicator) ×1 IMPLANT
CLOTH COMFORT BATH 8PK (Patient Supply) ×1 IMPLANT
CONTAINER HISTOLOGY 60 ML 30 ML GRADUATE LEAK RESISTANT O RING PREFILL (Procedure Accessories) ×15 IMPLANT
COVER PROBE NEOGUARD L11.8 IN X W1 IN ELASTIC BAND ENDOCAVITY (Other) ×1 IMPLANT
DRAPE SURGICAL 2 INCISE FILM (Drape) ×1 IMPLANT
GEL ULTRASOUND CLEAR IMAGE BOTTLE BACTERIOSTATIC MEDIUM VISCOSITY SCAN (Procedure Accessories) ×1 IMPLANT
INSTRUMENT BIOPSY L20 CM ANGLE D22 MM 2 TRIGGER ULTRA SHARP TIP BEVEL (Procedure Accessories) ×1 IMPLANT
JELLY LUBRICANT ADHESIVE WATER SOLUBLE FLIP TOP TUBE 2 OZ (Other) ×1 IMPLANT
NEEDLE BIOPSY PRECISIONPOINT TRANSPERINEAL ACC SYS (Procedure Accessories) ×1 IMPLANT
NEEDLE HYPODERMIC L1 1/2 IN OD18 GA REGULAR WALL SAFETYGLIDE (Needles) ×1 IMPLANT
NEEDLE HYPODERMIC L1 IN OD25 GA SAFETY (Needles) ×1 IMPLANT
STRAP POSITIONING L60 IN X W4 IN RING ADJUSTABLE HOOK LOOP CLOSURE (Positioning Supplies) ×1 IMPLANT
SYRINGE 10 ML CONTROL CONCENTRIC TIP PYROGEN FREE DEHP FREE LOK (Syringes, Needles) ×1 IMPLANT
TOWEL L27 IN X W17 IN COTTON PREWASH DELINT HIGH ABSORBENT BLUE (Other) ×1 IMPLANT
WRAP COMPRESSION L5 YD X W2 IN SELF ADHESIVE ELASTIC LIGHTWEIGHT FULL (Procedure Accessories) IMPLANT

## 2024-01-17 NOTE — Anesthesia Preprocedure Evaluation (Signed)
 Anesthesia Evaluation    AIRWAY      TM distance: >3 FB  Neck ROM: full  Mouth Opening:full  Planned to use difficult airway equipment: No CARDIOVASCULAR    cardiovascular exam normal       DENTAL    no notable dental hx               PULMONARY    clear to auscultation     OTHER FINDINGS    No loose teeth  Allergies Reviewed  Adequately NPO  No FH or personal hx of anesthetic complications  No cough, cold, flu, fever or other URI sx over the last 2 wks  No Acid Reflux/GERD                                      Relevant Problems   CARDIO   (+) Hypertension   (+) Thoracic aortic aneurysm without rupture, unspecified part               Anesthesia Plan    ASA 3     general                     intravenous induction   Detailed anesthesia plan: general IV        Post op pain management: per surgeon and PO analgesics    informed consent obtained      pertinent labs reviewed               Signed by: Lang JINNY Molt, MD 01/17/24 9:55 AM

## 2024-01-17 NOTE — Anesthesia Postprocedure Evaluation (Signed)
 Anesthesia Post Evaluation    Patient: Noah Wells    BIOPSY, PROSTATE, TRANSRECTAL ULTRASOUND (TRUS), INTEGRATED MAGNETIC RESONANCE IMAGING (MRI)    Anesthesia type: general    Last Vitals:   Vitals Value Taken Time   BP 101/66 01/17/24 11:20   Temp 36.1 C (97 F) 01/17/24 11:20   Pulse 53 01/17/24 11:20   Resp 15 01/17/24 11:20   SpO2 96 % 01/17/24 11:20                 Anesthesia Post Evaluation:     Patient Evaluated: PACU  Patient Participation: complete - patient participated  Level of Consciousness: awake and alert  Pain Score: 1  Pain Management: adequate  Multimodal analgesia pain management approach  Strategies: acetaminophen, local anesthesia and steroids  Airway Patency: patent  Two or more mitigation strategies used for obstructive sleep apnea.  Strategies: multimodal analgesia, awake extubation, intraop administration of CPAP/nasophargyneal airway/oral appliance and extubation/recovery carried out in nonsupine position    Anesthetic complications: No      PONV Status: none    Cardiovascular status: acceptable  Respiratory status: acceptable  Hydration status: acceptable          Signed by: Lang JINNY Molt, MD, 01/17/2024 11:34 AM

## 2024-01-17 NOTE — Discharge Instr - AVS First Page (Addendum)
Procedure:  BIOPSY, PROSTATE, WITH INTEGRATED MRI-TRANSRECTAL ULTRASOUND ([place]) GUIDANCE    Anesthesia:   Have an adult family member or friend drive you home. For the first 24 hours after your surgery:  Do not drive or use heavy equipment.  Do not make important decisions or sign documents.  Avoid alcohol.  No Exercising day of procedure.   Have someone stay with you, if possible if you have a history of Sleep Apnea. They can watch for problems and help keep you safe.  Be sure to keep all follow-up doctor's appointments.       Home care    You had the biopsy done through your rectum, your urethra, or through the skin between your scrotum and rectum. Your healthcare provider will tell you what to do after the biopsy. These instructions are based on your health condition, the type of biopsy, and your provider's practices.    Drink 8 ounces of water every 30 minutes for 2 hours. This will help ease any discomfort. Stay well hydrated for the remainder of the day/   You may see minor bleeding after the procedure. This is normal and often needs no treatment.   You may see blood in your semen (rust color). This may last for 1 to 2 months. This does not pose a health risk for you or your partner.   You may also have light bleeding from your rectum if you have hemorrhoids.   When you urinate you may go more often, feel burning, and see pink-tinged urine for up to 7 to 14 days after the biopsy.  Avoid sitting on a hard chair or stationary bike until discomfort has resolved.    You may resume exercising as tolerated  You may resume your medication regimen.   If you have discomfort post procedure, you make take Tylenol and/or use an Icepack     When to seek medical advice    Call your healthcare provider right away if any of these occur:    You aren't able to urinate, with or without your bladder feeling full, or see that you have less flow of urine  Chills  Fever of 100.56F (38C) or higher, or as directed by your  provider  Severe pain  Signs of an infection that is getting worse. These include worsening pain, pain in your side under the rib cage or in the low back, or bad-smelling urine.  Bright red blood clots or bright red blood in your stool or urine  You feel confused or very tired  Your lower belly feels firm over your bladder area    S

## 2024-01-17 NOTE — Op Note (Signed)
 FULL OPERATIVE NOTE    Date Time: 01/17/24 11:14 AM  Patient Name: Noah Wells, Noah Wells (MRN: [id number])  Attending Physician: Hale Quince CROME, MD      Date of Operation:   01/17/2024    Providers Performing:   Surgeons and Role:     DEWAINE Hale Quince CROME, MD - Primary    Surgical First Assistant(s):   None    Operative Procedure:   BIOPSY, PROSTATE, TRANSRECTAL ULTRASOUND (TRUS), INTEGRATED MAGNETIC RESONANCE IMAGING (MRI): 44299 (CPT)      Preoperative Diagnosis:   Pre-Op Diagnosis Codes:      * Elevated PSA [R97.20]     * Abnormal MRI, pelvis [R93.5]    Postoperative Diagnosis:   Post-Op Diagnosis Codes:     * Elevated PSA [R97.20]     * Abnormal MRI, pelvis [R93.5]    Anesthesia:   general    Findings:   nldre    Indications:   Elevated psa    Operative Notes:     The patient was brought the operating room after informed consent was obtained.  Anesthesia was administered by the anesthesia service.  The patient was placed in the dorsal lithotomy position.  The scrotum was elevated and secured with Coban to the lower abdomen.   The perineum was prepped using Chroloprep. A time out was performed.  The BK US  probe was prepared and the PPT access system clamp was firmly positioned along the midline position of the sagittal transducer.  The ultrasound probe was placed into the rectum and ultrasound measurement of the prostate is performed with prostate height, width and length recorded.  Scanning of the prostate was performed identifying regions of interest. Using the Uronav system the prostate was imaged from base to apex.  The contours of the prostate were then marked and registration with the previously marked MRI Dynacad was performed.   The access point for entry into the perineum was marked on the perineal skin.  7 cc of 1% lidocaine was injected at the access point directed into the skin and the subcutaneous tissue bilaterally.   The carriage  and access needle were then placed on the sliding rails of the PPT system.   The skin and subcutaneous tissue was punctured by pushing the access needle hub and simultaneously advancing the probe.  After perforating the skin,  PPT was positioned so that the stabilization bars compress the perineal skin and subcutaneous tissue.   Using the Bache system the MRI target (s) were then sampled with the BardT Tru-cut biopsy that was placed through the access needle and visualized under ultrasound imaging as it is advanced through the perineal subcutaneous tissue and into the prostate.   With the MRI target (s) sampled a standard templated 14 core prostate was obtained under ultrasound guidance.  All samples were placed in formalin and sent to pathology.  The access needle was disengaged and the probe withdrawn from the rectum.  The patient tolerated the procedure well and was transferred to the recovery room in stable condition                Estimated Blood Loss:   * No values recorded between 01/17/2024 10:51 AM and 01/17/2024 11:09 AM *    Implants:   * No implants in log *       Drains:   Drains: no      Specimens:     ID Type Source Tests Collected by Time Destination   1 : left medial apex  Tissue Prostate SURGICAL PATHOLOGY Hale Quince CROME, MD 01/17/2024 [phone]    2 : left lateral apex Tissue Prostate SURGICAL PATHOLOGY Hale Quince CROME, MD 01/17/2024 [phone]    3 : left medial mid Tissue Prostate SURGICAL PATHOLOGY Hale Quince CROME, MD 01/17/2024 [phone]    4 : left lateral mid Tissue Prostate SURGICAL PATHOLOGY Hale Quince CROME, MD 01/17/2024 [phone]    5 : left medial base Tissue Prostate SURGICAL PATHOLOGY Hale Quince CROME, MD 01/17/2024 [phone]    6 : left lateral base Tissue Prostate SURGICAL PATHOLOGY Hale Quince CROME, MD 01/17/2024 [phone]    7 : left anterior Tissue Prostate SURGICAL PATHOLOGY Hale Quince CROME, MD 01/17/2024 [phone]    8 : right medial apex Tissue Prostate SURGICAL PATHOLOGY Hale Quince CROME, MD 01/17/2024 [phone]    9 : right lateral apex Tissue Prostate SURGICAL PATHOLOGY Hale Quince CROME, MD 01/17/2024 [phone]    10 :  right medial mid Tissue Prostate SURGICAL PATHOLOGY Hale Quince CROME, MD 01/17/2024 [phone]    11 : right lateral  mid Tissue Prostate SURGICAL PATHOLOGY Hale Quince CROME, MD 01/17/2024 [phone]    12 : right medial base Tissue Prostate SURGICAL PATHOLOGY Hale Quince CROME, MD 01/17/2024 [phone]    13 : right lateral base Tissue Prostate SURGICAL PATHOLOGY Hale Quince CROME, MD 01/17/2024 [phone]    14 : right anterior Tissue Prostate SURGICAL PATHOLOGY Hale Quince CROME, MD 01/17/2024 [phone]    15 : target # 1 Tissue Prostate SURGICAL PATHOLOGY Hale Quince CROME, MD 01/17/2024 [phone]          Complications:   None    Signed by: Quince CROME Hale, MD  [facility]

## 2024-01-18 ENCOUNTER — Encounter: Payer: Self-pay | Admitting: Urology

## 2024-01-23 ENCOUNTER — Ambulatory Visit (INDEPENDENT_AMBULATORY_CARE_PROVIDER_SITE_OTHER): Admitting: Nurse Practitioner

## 2024-01-23 ENCOUNTER — Encounter (INDEPENDENT_AMBULATORY_CARE_PROVIDER_SITE_OTHER): Payer: Self-pay | Admitting: Nurse Practitioner

## 2024-01-23 VITALS — BP 125/69 | HR 65 | Ht 74.0 in | Wt 214.0 lb

## 2024-01-23 DIAGNOSIS — I712 Thoracic aortic aneurysm, without rupture, unspecified: Secondary | ICD-10-CM

## 2024-01-23 DIAGNOSIS — E785 Hyperlipidemia, unspecified: Secondary | ICD-10-CM

## 2024-01-23 DIAGNOSIS — I10 Essential (primary) hypertension: Secondary | ICD-10-CM

## 2024-01-23 LAB — ECG 12-LEAD
Atrial Rate: 64 {beats}/min
IHS MUSE NARRATIVE AND IMPRESSION: NORMAL
P Axis: 73 degrees
P-R Interval: 182 ms
Q-T Interval: 454 ms
QRS Duration: 136 ms
QTC Calculation (Bezet): 468 ms
R Axis: -65 degrees
T Axis: -13 degrees
Ventricular Rate: 64 {beats}/min

## 2024-01-23 MED ORDER — ATORVASTATIN CALCIUM 10 MG PO TABS
10.0000 mg | ORAL_TABLET | Freq: Every day | ORAL | 3 refills | Status: AC
Start: 2024-01-23 — End: ?

## 2024-01-23 NOTE — Patient Instructions (Signed)
Avoid heavy straining, avoid lifting more than 50 lbs  Do mild to moderate but not high intensity exercise  Keep Heart rate around 125 or less  Goal Blood pressure is less than 125/80  Continue to monitor your blood pressure  Continue to hydrate well

## 2024-01-23 NOTE — Progress Notes (Signed)
  CARDIOLOGY OFFICE VISIT    History of Present Illness  Noah Wells is a 73 year old male with hypertension, type 2 diabetes, hyperlipidemia, gout, right bundle branch block, and thoracic aortic aneurysm who presents for a follow-up visit.    Hypertension is managed with losartan 50 mg once daily, amlodipine  5 mg once daily, Toprol XL 100 mg twice daily, and hydrochlorothiazide 25 mg once daily. Blood pressure is well-controlled at 125/69 mmHg.    Hyperlipidemia is treated with Lipitor 20 mg once daily. The lipid profile from November 22, 2023, shows total cholesterol at 92 mg/dL, HDL at 50 mg/dL, triglycerides at 56 mg/dL, and LDL at 28 mg/dL.    An echocardiogram on November 21, 2023, shows an ascending aorta measuring 3.7 cm, consistent with previous findings from Dec 22, 2022.    Medications:   hydroCHLOROthiazide, Take 1 tablet (25 mg) by mouth once daily  metoprolol succinate, Take 1 tablet (100 mg) by mouth 2 (two) times daily  aspirin EC, Take 1 tablet (81 mg) by mouth once daily (Patient not taking: Reported on 01/23/2024)  atorvastatin , Take 1 tablet (20 mg) by mouth daily  amLODIPine , Take 1 tablet (5 mg) by mouth daily  losartan,     allopurinol, Take 1 tablet (300 mg) by mouth once daily  glipiZIDE, Take 1 tablet (10 mg) by mouth 2 (two) times daily  Synjardy XR, Take 1 tablet by mouth once nightly (Patient taking differently: Take 1 tablet by mouth 2 (two) times daily)        Physical Examination:  Vital Signs: BP 125/69 (BP Site: Left arm, Patient Position: Sitting, Cuff Size: Medium)   Pulse 65   Ht 1.88 m (6' 2)   Wt 97.1 kg (214 lb)   SpO2 96%   BMI 27.48 kg/m    Chest: Clear to auscultation bilaterally  Cardiovascular: No murmurs or gallops.   Abdomen: Soft, nontender. No pulsatile masses or bruits.    Extremities: Warm without edema. Peripheral pulses are full and equal.    Labs:   Lab Results   Component Value Date    NA 141 12/14/2022    K 4.4 12/14/2022    BUN 20 12/14/2022    CREAT  0.92 12/14/2022    GLU 92 12/14/2022    CHOL 117 12/14/2022    TRIG 103 12/14/2022    HDL 46 12/14/2022    LDL 52 12/14/2022    AST 26 12/14/2022    ALT 32 12/14/2022    TSH 2.26 02/18/2014   Echocardiogram November 21, 2023  Summary    * The ascending aorta is mildly dilated at 4.0 cm in diameter.    * The aortic root is mildly to moderately dilated at 4.5 cm in diameter.    * The left ventricle is mildly dilated. Sigmoid shaped septum, moderate  basal septal hypertrophy (1.6 cm thickness).    * There is moderate eccentric left ventricular hypertrophy.    * Left ventricular systolic function is normal with an ejection fraction by  Biplane Method of Discs of  61 %.    * Left ventricular segmental wall motion is normal.    * There is Grade I diastolic dysfunction (impaired relaxation pattern).    * The right ventricular cavity size is mildly dilated.    * Normal right ventricular systolic function.    * The left atrium is normal in size.    * The right atrium is dilated.    * No significant  valvular dysfunction.    * Compared to the prior study, ascending aorta size unchanged on direct  review. LV size reported as dilated when measured at mid LV cavity apical to  basal sigmoid septum. On direct review no significant change in LV size.  Overall, no significant change from prior echo..  Assessment & Plan  Thoracic Aortic Aneurysm  Aortic root 4.5 cm, ascending aorta 4.0 cm, stable. Surgical intervention at 5 cm. Advised lifestyle modifications to prevent progression.  - Avoid lifting >50 pounds.  - Engage in mild to moderate exercise.  - Maintain BP <125/80 mmHg.  - Maintain HR <125 bpm.  - Hydrate well.  - Order echocardiogram April 2026.    Hypertension  BP controlled at 125/69 mmHg on current regimen. No recent home monitoring.  - Continue losartan 50 mg daily.  - Continue Toprol XL 100 mg twice daily.  - Continue hydrochlorothiazide 25 mg daily.  - Encourage home BP monitoring.    Hyperlipidemia  Lipid profile shows  low levels. Endocrinologist recommended reducing Lipitor.  - Reduce Lipitor to 10 mg daily.  - Order repeat lipid profile in 3 months.  - Send Lipitor 10 mg prescription to [facility], [place].    Verbal consent was obtained to record this visit.

## 2024-01-25 ENCOUNTER — Ambulatory Visit: Attending: Urology | Admitting: Urology

## 2024-01-25 DIAGNOSIS — R935 Abnormal findings on diagnostic imaging of other abdominal regions, including retroperitoneum: Secondary | ICD-10-CM

## 2024-01-25 DIAGNOSIS — C61 Malignant neoplasm of prostate: Secondary | ICD-10-CM

## 2024-01-25 DIAGNOSIS — R972 Elevated prostate specific antigen [PSA]: Secondary | ICD-10-CM

## 2024-01-25 NOTE — Progress Notes (Signed)
 Subjective:      Patient ID: Noah Wells is a 73 y.o. male     Chief Complaint:  Verbal consent has been obtained from the patient to conduct a video and telephone visit to minimize exposure to COVID-19      73 y.o. male with favorable intermediate risk prostate cancer     07/14/2021 PSA 3.75  08/06/2022 PSA 4.57  12/19/2022 PSA 5.0  05/16/2023 PSA 6.7  08/11/2023 PSA 6.1     09/05/2023 MRI prostate volume 61 cc left peripheral zone lesion mid gland 1.4 cm PI-RADS 4, trabeculated bladder with small bladder diverticula noted.  No ECE, no LAD -MRI done in [place]     PSAD 0.1        12/08/2023 MRI VOL 56cc, Lesion 1 left pz apex PIRAD 4    01/17/2024 MRI targeted bx - target Gleason 3+3=6, left medial mid Gleason 3+4=7, and 3 others w/ Gleason 3+3=6         PMH - DM, HTN, gout, HLD, prostate cancer  PSH-knee meniscus repair  Medications allopurinol, amlodipine , ASA, Lipitor, hydrochlorothiazide, Cozaar, metoprolol  No known drug allergies            The following portions of the patient's history were reviewed and updated as appropriate: allergies, current medications, past family history, past medical history, past social history, past surgical history and problem list.    Review of Systems  Systems reviewed per the HPI and below:     History obtained from the patient     General ROS: no fevers, or chills     Gastrointestinal ROS: no abdominal pain, change in bowel habits     Musculoskeletal ROS:  no swelling to lower extremities, no back pain     Objective:   TELEMEDICINE VISIT    Physical Exam   Constitutional:  Well-developed, well-nourished, and in no distress.  Neurological: Pt is alert and oriented to person, place, and time.    Psychiatric: Mood, memory, affect and judgment normal.       Lab Review   Reviewed results as listed below. Discussed findings with patient.         1 Result Note      Component  Ref Range & Units (hover)    Final Diagnosis   A. Prostate, left medial apex, core needle biopsy:            -  Benign prostatic tissue            - Negative for carcinoma     B. 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 Prostate, left medial mid, core needle biopsy:            - Prostatic acinar adenocarcinoma, gleason score 3+4=7 involving 1 out of 1 core (30%) (5% gleason pattern 4)            - Perineural invasion identified     D. Prostate, left lateral mid, core needle biopsy:            - Prostatic acinar adenocarcinoma, gleason score 3+3=6 involving 1 out of 1 core (5%)     E. Prostate, left medial base, core needle biopsy:            -  High grade prostatic intraepithelial neoplasia (PIN)     F. 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 Prostate, left anterior, core needle biopsy:            - Benign prostatic tissue            - Negative for carcinoma     H. Prostate, right medial apex, core needle biopsy:            - Benign prostatic tissue            - Negative for carcinoma     I. Prostate, right lateral apex, core needle biopsy:            - Benign prostatic tissue            - Negative for carcinoma     J. Prostate, right medial mid, core needle biopsy:            - Benign prostatic tissue            - Negative for carcinoma     K. Prostate, right lateral mid, core needle biopsy:            - Benign prostatic tissue            - Negative for carcinoma     L. Prostate, right medial base, core needle biopsy:            - Benign prostatic tissue            - Negative for carcinoma     M. Prostate, right lateral base, core needle biopsy:            - Benign prostatic tissue            - Negative for carcinoma     N. Prostate, right anterior, core needle biopsy:            - Benign prostatic tissue             - Negative for carcinoma     O. Prostate, target #1, core needle biopsy:            - Prostatic acinar adenocarcinoma, gleason score 3+3=6 involving 3 out of multiple cores (90%, 30%, ~5%)      Electronically reviewed and signed: Rosaline Flowers, M.D.             Radiology Review   Reviewed results as listed below. Discussed results with the patient and answered questions to the best of my ability.   As above      Assessment/plan     1. Elevated PSA    2. Abnormal MRI, pelvis    3. Prostate cancer (CMS/HCC)      Reviewed path Newly dx prostate cancer    Favorable intermediate risk prostate cancer  Briefly reviewed options to include AS, surgery and radiation    Recommend decipher test and once results available an appt with myself and radiation oncology      Patient expressed understanding of all the above.   All questions were answered to the best of my ability.    I spent 40 total  minutes for the following activities:  Interviewing/examining the patient  Reviewing relevant notes  Reviewing/interpreting/ordering tests and medications  Counseling and educating the patient/family/caregiver with care coordination  Documenting/charting clinical information in EPIC.          There are no Patient Instructions on file for this visit.    Orders  No orders of the defined types were placed in this encounter.

## 2024-01-30 ENCOUNTER — Telehealth: Payer: Self-pay

## 2024-01-30 NOTE — Telephone Encounter (Addendum)
 [facility] GU Oncology Nurse Navigator      New Patient Referral for GU-MDC:    Referral received from Dr. ROSNER for GU-MDC; prostate cancer; Gleason 3+4; FIR    ONN reached out to patient to discuss GU-MDC referral.  ONN discussed format, purpose and timing of GU-MDC clinic.  ONN discussed any further imaging needed, Decipher testing, and Health and safety inspector for non-Medicare patients.  Pt agreed to move forward with referral, all testing and scheduling of appointments.  ONN sent NCCN educational information on diagnosis, Decipher, and Clinic contact information.  Pt questions answered to the best of my ability.  Pt with ONN direct contact information.  Pt acknowledged plan and verbalized understanding.    NPC updated and aware of imaging requests and Decipher order.  ONN remains available.  Radiation Oncology, IPSS and SHIM questionnaires sent to patient via mychart.  Pt acknowledged information being sent.  Referring provider updated.    See new Orders:  [x]   DECIPHER--[id number]                      Noah Hugger, Noah Wells CPAN  Oncology Nurse Navigator-GU Malignancies   [facility]  [employer]  [phone]  [email or web address] Noah Hugger, Noah Wells   Oncology Nurse Navigator-GU Malignancies   [facility]  [employer]  [phone]  [email or web address]

## 2024-02-12 ENCOUNTER — Other Ambulatory Visit (INDEPENDENT_AMBULATORY_CARE_PROVIDER_SITE_OTHER): Payer: Self-pay

## 2024-02-12 ENCOUNTER — Encounter (INDEPENDENT_AMBULATORY_CARE_PROVIDER_SITE_OTHER): Payer: Self-pay | Admitting: Urology

## 2024-02-13 ENCOUNTER — Ambulatory Visit
Admission: RE | Admit: 2024-02-13 | Discharge: 2024-02-13 | Disposition: A | Discharge status: Home / Self Care | Source: Ambulatory Visit | Attending: Urology | Admitting: Urology

## 2024-02-13 DIAGNOSIS — C61 Malignant neoplasm of prostate: Secondary | ICD-10-CM | POA: Insufficient documentation

## 2024-02-13 LAB — SURGICAL PATHOLOGY

## 2024-02-28 NOTE — Progress Notes (Signed)
 [facility]  New Patient Evaluation    Date: 02/29/2024  Patient Name: Noah Wells,Noah Wells    Collaborating Provider(s):   - Evalene Due, MD (primary care)  - Quince Neer, MD (urology)   - Clabe Brunette, NP (cardiology)     Diagnosis:   Adenocarcinoma of the prostate    Stage:   IIB (cT1c, cN0, cM0, PSA 6.1, Grade Group 2)    Genetic/Molecular Testing:   None    Prior Therapy:   None    Current Therapy:   To be determined    Oncologic Diagnostic History:   - 05/28/2021: PSA 5.0.  - 07/14/2021: PSA 3.75.  - 05/30/2022: PSA 5.2.  - 07/27/2022: PSA 4.57.  - 12/19/2022: PSA 5.01.  - 05/16/2023: PSA 6.7.  - 08/11/2023: PSA 6.1.  - 09/05/2023: MRI prostate with/without contrast revealed a 61cc prostate with a PI-RADS 4 lesion in the left PZ mid gland to base with no e/o ECE, SVI, enlarged nodes, or osseous lesion.   - 12/08/2023: MRI prostate with/without contrast revealed a 56cc prostate with a PI-RADS 4 lesion in the left posterior PZ at the apex with no e/o ECE, enlarged nodes, or suspicious bone lesion.  - 01/17/2024: Prostate biopsy performed. Pathology revealed prostatic acinar adenocarcinoma in 5/15 cores (all left sided + target #1), highest Gleason 3+4=7, Decipher low risk (score 0.37).    History of Present Illness:   Noah Wells is a 73 y.o. male who presents to clinic for evaluation of prostate cancer. The following history was obtained from the patient and by review of the electronic medical record.    Oncologic history outlined above.    The patient reports to be feeling well. He has no concerns to share. He presents today with his wife.    Review of Systems:   14-point review of systems covered in detail and negative, except as stated above under HPI.    Past Medical History:     Past Medical History:   Diagnosis Date    Diabetes mellitus (CMS/HCC)     glucose avg 150's    Gout     Hand fracture     Hyperlipidemia     Hypertension     Prostate cancer (CMS/HCC)     RBBB      Past Surgical History:    Past Surgical History[1]    Family History:   Family History[2]    Social History:   Social History[3]     Allergies:   Allergies[4]    Medications:   Current Medications[5]    Physical Exam:     Vitals:    02/29/24 0821   BP: 106/57   Pulse: 75   Resp: 16   Temp: 97 F (36.1 C)   SpO2: 100%     ECOG 0 (fully active, able to carry on all pre-disease performance without restriction)    General appearance - alert, well appearing, and in no distress and normal appearing weight  Mental status - alert, oriented to person, place, and time, normal mood, behavior, speech, dress, motor activity, and thought processes  Eyes - sclera anicteric, extraocular muscles intact  Neurological - alert, oriented, normal speech, no focal findings or movement disorder noted  Psychologic - pleasant, conversant, appropriate, cooperative    Laboratory:     No results found for: WBC, HGB, HCT, MCV, PLT    No results found for: NEUTROABS    Chemistry        Component Value Date/Time  NA 141 12/14/2022 0726    K 4.4 12/14/2022 0726    CL 105 12/14/2022 0726    CO2 30 12/14/2022 0726    BUN 20 12/14/2022 0726    CREAT 0.92 12/14/2022 0726    GLU 92 12/14/2022 0726        Component Value Date/Time    CA 9.6 12/14/2022 0726    ALKPHOS 55 12/14/2022 0726    AST 26 12/14/2022 0726    ALT 32 12/14/2022 0726    BILITOTAL 1.0 12/14/2022 0726        No results found for: PSA, TESTOSTERONE    Radiology:   Reviewed in Epic.    Pathology:   Reviewed in Epic.    Assessment & Plan:   Noah Wells is a 73 y.o. male who presents to clinic for evaluation of prostate cancer.    Medical record reviewed.    Prostate cancer: Oncologic history outlined above. Patient appears to have clinical stage IIB (cT1c, cN0, cM0, PSA 6.1, Grade Group 2) adenocarcinoma of the prostate. He is favorable intermediate risk as per NCCN risk stratification due to his Grade Group 2 status and low risk per Decipher (score score 0.37).    - Discussed  management options as per NCCN: AS, RT, RP.  - Patient to meet with Urology and Radiation Oncology to discuss treatment options.  - Discussed the role of ADT to treat some instances of localized prostate cancer. Emphasized that at this time, there is no indication for the patient to receive ADT. The patient was provided an educational document on ADT. All questions answered.  - Repeat PSA to assure still <10. If >10, then would offer ADT if he chooses to pursue definitive RT rather than RP.    Disposition: All of the patient's questions were answered to his satisfaction. He will return to clinic in the future as needed. He has my number and knows to call if any questions or concerns arise.    A total of 25 minutes were spent face-to-face with the patient during this encounter and over half of that time was spent on counseling and coordination of care. This case was also reviewed as part of a multi-disciplinary conference.     Fonda Dawn, M.D.  Hematology & Medical Oncology  [facility]         [1]   Past Surgical History:  Procedure Laterality Date    ARTHROSCOPY KNEE, MENISCUS REPAIR      BIOPSY, PROSTATE, TRANSRECTAL ULTRASOUND (TRUS), INTEGRATED MAGNETIC RESONANCE IMAGING (MRI) N/A 01/17/2024    Procedure: BIOPSY, PROSTATE, TRANSRECTAL ULTRASOUND (TRUS), INTEGRATED MAGNETIC RESONANCE IMAGING (MRI);  Surgeon: Hale Quince CROME, MD;  Location: [facility];  Service: Urology;  Laterality: N/A;    COLONOSCOPY, DIAGNOSTIC (SCREENING)      EGD     [2]   Family History  Problem Relation Name Age of Onset    Lymphoma Father  62   [3]   Social History  Tobacco Use    Smoking status: Never    Smokeless tobacco: Never   Vaping Use    Vaping status: Never Used   Substance Use Topics    Alcohol use: Yes     Alcohol/week: 14.0 standard drinks of alcohol     Types: 14 Glasses of wine per week    Drug use: No   [4]   Allergies  Allergen Reactions    Penicillins Other (See Comments)     Junior year in  college-1974, unknown reaction   [5]   Current Outpatient Medications   Medication Sig Dispense Refill    allopurinol (ZYLOPRIM) 300 MG tablet Take 1 tablet (300 mg) by mouth once daily      amLODIPine  (NORVASC ) 5 MG tablet Take 1 tablet (5 mg) by mouth daily 90 tablet 3    aspirin EC 81 MG EC tablet Take 1 tablet (81 mg) by mouth once daily (Patient not taking: Reported on 02/29/2024)      atorvastatin  (Lipitor) 10 MG tablet Take 1 tablet (10 mg) by mouth once daily 90 tablet 3    glipiZIDE (GLUCOTROL) 10 MG 24 hr tablet Take 1 tablet (10 mg) by mouth 2 (two) times daily      hydrochlorothiazide (HYDRODIURIL) 25 MG tablet Take 1 tablet (25 mg) by mouth once daily  0    losartan (COZAAR) 50 MG tablet       metoprolol (TOPROL-XL) 100 MG 24 hr tablet Take 1 tablet (100 mg) by mouth 2 (two) times daily      Synjardy XR 12.12-998 MG Tablet SR 24 hr Take 1 tablet by mouth once nightly (Patient taking differently: Take 2 tablets by mouth once at bedtime)       No current facility-administered medications for this visit.

## 2024-02-29 ENCOUNTER — Encounter: Payer: Self-pay | Admitting: Urology

## 2024-02-29 ENCOUNTER — Encounter: Payer: Self-pay | Admitting: Radiation Oncology

## 2024-02-29 ENCOUNTER — Encounter: Payer: Self-pay | Admitting: Medical Oncology

## 2024-02-29 ENCOUNTER — Ambulatory Visit: Admitting: Radiation Oncology

## 2024-02-29 ENCOUNTER — Other Ambulatory Visit: Payer: Self-pay | Admitting: Urology

## 2024-02-29 ENCOUNTER — Ambulatory Visit: Admitting: Urology

## 2024-02-29 ENCOUNTER — Ambulatory Visit: Attending: Medical Oncology | Admitting: Medical Oncology

## 2024-02-29 VITALS — BP 106/57 | HR 75 | Temp 97.0°F | Resp 16 | Ht 74.0 in | Wt 217.6 lb

## 2024-02-29 DIAGNOSIS — R935 Abnormal findings on diagnostic imaging of other abdominal regions, including retroperitoneum: Secondary | ICD-10-CM

## 2024-02-29 DIAGNOSIS — Z7189 Other specified counseling: Secondary | ICD-10-CM | POA: Insufficient documentation

## 2024-02-29 DIAGNOSIS — C61 Malignant neoplasm of prostate: Secondary | ICD-10-CM | POA: Insufficient documentation

## 2024-02-29 DIAGNOSIS — R9721 Rising PSA following treatment for malignant neoplasm of prostate: Secondary | ICD-10-CM

## 2024-02-29 DIAGNOSIS — R972 Elevated prostate specific antigen [PSA]: Secondary | ICD-10-CM

## 2024-02-29 NOTE — Patient Instructions (Signed)
Prostate Cancer: Radical Prostatectomy     The prostate, seminal vesicles, and a portion of the urethra are removed.      Radical prostatectomy is surgery to remove the entire prostate. It may be done if tests show that the cancer is only found in the prostate and you are healthy enough to have surgery.  Your surgeon will give you detailed instructions on getting ready for surgery. After surgery, you’ll be told how to care for yourself at home as you recover. Ask any questions you have about the procedure and recovery. Also ask about possible side effects and what can be done to help prevent them or treat them.  Risks and possible complications  All surgeries have risks. The risks of this surgery include:  Blood clots in your legs or lungs  Excess bleeding  Damage to nearby organs  Hole (perforation) in the bowel (This is rare.)  Infection  Reactions to the medicines used to make you sleep during surgery (anesthesia)  Side effects of prostate surgery include:  Loss of bladder control (incontinence)  Lung infection (pneumonia)  Trouble getting or keeping an erection (erectile dysfunction)  Trouble urinating  Infertility  Orgasm changes  Shorter penis  Swelling in the legs or groin (lymphedema)  Risk for hernia  Talk with your treatment team about the risks of surgery and what can be done about them. Know what to expect and what to watch for after surgery.  Getting ready for your surgery  Follow all instructions from your healthcare team. Here are some of the things you might be asked to do:  Tell your healthcare provider about all medicines you take. This includes all prescription and over-the-counter medicines as well as herbs, vitamins, and other supplements. It also includes any blood thinners, such as warfarin, clopidogrel, or daily aspirin. You may need to stop taking some or all of them before the surgery.  You may be told to use a laxative, enemas, or both before the surgery. This is to empty the colon and  rectum of stool. Follow the instructions you are given.  Don’t eat or drink after midnight the night before surgery.  How the surgery is done  There are different ways to do prostate surgery and different ways to reach the prostate to do it. Your treatment team will talk with you about the plan they think is best for you.  The surgery may be done through many small cuts (incisions) made in the belly (abdomen). This is called laparoscopic surgery. Long, thin tools are put into these incisions to take out the prostate. A method called robotic-assisted laparoscopy might be used. This system uses robotic arms that the surgeon controls to do the surgery. It also gives a close-up 3-D view inside the body.    Sometimes the surgery is done through a larger incision in the lower abdomen. This is called the retropubic approach. Or the surgery m be done through an incision behind the scrotum. This is called the perineal approach.  During the surgery:  The surgeon may remove and check the lymph nodes near the prostate. This is to see if cancer has spread. If the cancer has spread, the surgeon may decide not to remove the prostate.  The prostate, seminal vesicles, and part of urethra will be taken out.  Nerve-sparing methods may be used to try to save erectile function.  After surgery  A catheter will be put through your penis and into your bladder during the surgery. It's used   to drain urine from your bladder into a bag. The urine may be bloody or cloudy at first. It'll stay in for a few weeks while you heal.     The urethra is reattached to the bladder. A catheter is inserted to drain urine while you heal. A balloon holds the catheter in place.      Recovering at home  You may go home 1 to 3 days after surgery. The catheter will be left in. You'll be taught how to manage it at home.  You’ll be given medicines to control pain. Your team will also teach you things like how to use your pain medicines, how to take care of your  incision, what you can and can't do, and when you should call them. They'll schedule your next appointment, too.  Make sure you know when you can drive and when you can go back to work. Plan to give yourself time to rest and heal for the first few weeks.  Follow-up care  The catheter and stitches will be removed at a follow-up visit. This is often 1 to 2 weeks after surgery. Bladder control often takes a few weeks to several months to return. It can continue to get better for up to a year.  When to call your healthcare provider  Call your healthcare provider right away if you have any of these:  Chills  Fever of 100.4°F (38°C) or higher, or as directed by your provider  Fluid leaking from your incision  Redness or pain in the incision that gets worse  Your incision opens  New swelling in your groin or leg  Pain that's not controlled or is getting worse  Swelling, warmth, redness, or pain in your leg  Trouble moving your bowels  Urine not draining from the catheter  Trouble passing urine after the catheter has been removed  Be sure you know what other problems you should watch for. Also know how to get help any time, including after office hours, on weekends, and on holidays.  [employer] last reviewed this educational content on 02/12/2018     © 2000-2022 [employer]. All rights reserved. This information is not intended as a substitute for professional medical care. Always follow your healthcare professional's instructions.

## 2024-02-29 NOTE — Progress Notes (Signed)
 RADIATION ONCOLOGY CONSULTATION    Patient Name: Noah Wells  Date of Birth: 04-10-1951  Medical Record Number: [id number]  Encounter Date: 02/29/2024  Radiation Oncology Physician: Zena Miu, MD MPharm    Consulting Physicians and Providers:  Referring physician or provider: Quince Neer, MD  Primary care physician or provider: Wyatt Evalene BIRCH, MD  Urology: Quince Neer, MD  Medical oncology: Fonda Dawn, MD    DIAGNOSIS:     1. Prostate cancer (CMS/HCC)          ASSESSMENT AND PLAN:     Hardie Veltre is a 73 y.o. 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  He is seen today at the request of Dr. Quince Neer for evaluation and management of his disease.    Intermediate Risk Prostate Cancer    I spent a considerable amount of time today with Reyes Oneil Slate discussing various prognostic factors. We discussed the NCCN risk stratification categories in detail. Based on his PSA, Gleason score, percent cores positive, and clinical stage, Jakaden Ouzts has Intermediate Risk adenocarcinoma of the prostate. T2b-T2c disease, Gleason 7 disease or PSA 10-20. His adverse features include none. His DECIPHER score is 0.37, low risk.     We discussed the various treatment options available based on the NCCN Guidelines. His treatment options are as follows:      1. Surgery     I discussed with the patient that surgery for definitive management of prostate cancer is an excellent option. There would be a small chance that after surgery, he would need adjuvant radiation therapy. We did not get into the details of the role of radiation therapy in the adjuvant setting, as I felt it was unlikely he would end up with positive margins, or extracapsular extension, or seminal vesicle invasion, or a persistently elevated prostate-specific antigen.      2. External Beam Radiation Therapy  with or without ADT for 4-6 months     We discussed the logistics of fractionated radiotherapy with photons and protons. This will require appproximately 4-8 weeks of daily therapy delivered Monday through Friday. Each fraction or treatment is approximately 10-15 minutes. Prior to initiating therapy, he will require a planning session or a simulation. During each treatment, image guidance is used to further enhance the accuracy of therapy. In his case I would consider fiducial based image guidance and spacer gel, given the lack of posterior ECE. He will discuss this with his urologist.     We discussed the expected short-term side effects as well as the potential for late complications. Short term side effects include mild urinary bother including frequency, urgency, hesitancy, and dysuria. These symptoms are typically alleviated with the use of Flomax or other drugs in the same class. Other side effects during therapy include mild fatigue, hemorrhoid exacerbation or mild proctitis, and bowel urgency/frequency. These symptoms are typically manageable with over-the-counter medications. Long term complications are rare with the use of IMRT and IGRT. Most modern series indicate less than 3-5% risk of Grade 3 GI and GU complications including rectal bleeding requiring laser therapy or cystitis requiring intervention. Erectile dysfunction is possible in men after IMRT. This occurs in approximately 40-50% of potent men after 3-5 years. Typically, medical management helps in ED related to IMRT.     We discussed the typical favorable outcomes in terms of biochemical control, local control, low risk  of distant metastatic disease and prostate cancer mortality in patients with intermediate risk disease undergoing IMRT. The results of a large randomized trial (RTOG 0815) have recently been reported. In patients not receiving hormones, the biochemical control rate is 79% at 8 years, with low rates of prostate cancer specific  death and distant metastasis (4%). These results could potentially be even further improved with the use of ADT.     We discussed the potential role for ADT in patient receiving IMRT. When ADT is given, it is typically given 2 months neo-adjuvantly, and continued throughout the duration of radiation therapy and then given for 2 months after completing radiation therapy (total 6 months). In RTOG 0815, SCADT improved biochemical control (85% vs 77% at 8 years) and freedom from salvage ADT (6 vs10%), without an improvement in overall survival (79% at 8 years for all patients). The effect appears to persist regardless of favorable risk stratification, and whether molecular testing can inform this decision is an area of active study. My recommendation regarding ADT for [facility], would be no ADT. We also discussed the controversy regarding pelvic RT in patients with intermediate and high risk disease. Based on his [facility] nomogram, his risk of LN disease is low and he does not require treatment of the pelvis.     3. Prostate brachytherapy     We discussed the logistics of brachytherapy. He understands that this is an outpatient procedure that for LDR requires a planning TRUS in the office for planning an ordering seeds. For HDR, he would need two procedures and no need for pre-treatment TRUS.  We discussed the procedure itself, including the fact that he will require a catheter the day of the procedure that is typically removed the following day.      We discussed the expected short-term side effects as well as the potential for late complications. Short term side effects include mild urinary bother including frequency, urgency, hesitancy, and dysuria. These symptoms are typically alleviated with the use of Flomax or other drugs in the same class. Other side effects  include mild fatigue, hemorrhoid exacerbation or mild proctitis, and bowel urgency/frequency. Peri-operatively, there is small risk of urinary  obstruction shortly after the procedure requiring catheter placement, bleeding, and infections.  Urinary symptoms typically abate 3 months after implant. There is a possibility of long-term urinary bother requiring minor surgical procedures. These risks are exacerbated in patients with high baseline IPSS. I would be very cautious in men who have a baseline IPSS over 15 .Amarie Tarte has a baseline IPSS of 5 on no medical therapy.  With brachytherapy, there is also a risk of ED. In most studies that are done, the risk of ED is fairly low compared to other options in fully potent men. I would estimate risk of impotence to be about 30-40%.      The outcomes of brachytherapy can be good in patients with localized disease. Outcomes in terms of biochemical control would be estimated at approximately 75-85%.   With higher risk intermediate risk disease, it is reasonable to consider trimodality therapy, with ADT, EBRT and a brachytherapy boost, particularly where there is SVI/ECE, or a high risk of pelvic disease. The side effects in terms of late GU complications increase from <5% without supplemental IMRT to about 10-15% with the use of supplemental IMRT.      Demitris Pokorny is a good candidate for brachy (HDR or LDR) based on gland size, disease and urinary function.  4. CyberKnife/extreme hypofractionated radiotherapy. We discussed this as an option as it is listed in the NCCN guidelines. He understands that randomized data with long-term followup in the literature is limited. The recently presented PACE-B clinical trial suggests similar oncologic and side effect outcomes for appropriately selected patients. Urinary side effects can however be more pronounced. With higher risk intermediate risk disease, it is reasonable to consider trimodality therapy, with ADT, EBRT and a Cyberknife boost, particularly where there is SVI/ECE, or a high risk of pelvic disease.     In the end, I presented the various  treatment options and provided him with the NCCN guidelines.  Given the single core of Gleason 3+4 in the setting of decipher low risk disease, it would be reasonable as a first step to consider active surveillance before moving onto active therapy.  If the patient is interested in active therapy, he could certainly consider surgery however this is not mandatory.  With regards to radiotherapy, he would make an excellent candidate for brachytherapy, or indeed external beam in which case, I would typically favor CyberKnife given its convenience, efficacy, and minimal interval dose to surrounding tissues.  He will let us  know how he would like to proceed.    -As above  -Discussed treatment intent with patient and/or caregivers: Curative  -Pain management: Not applicable, patient not currently in pain    ONCOLOGIC DIAGNOSTIC AND TREATMENT HISTORY:         HISTORY OF PRESENTATION:     Encounter type: In-person  Interpreter required: No interpreter was required for this visit  Trevyn Lumpkin presents today with his wife.    Dysuria: No  Urinary modifiers (medications): No  Rectal bleeding: No  Average bowel movements per day: 2  Date of last colonoscopy: 2017  Any abnormal findings on last colonoscopy: None  Hip replacement: No  Prior TURP or urological procedures: No  Testosterone replacement therapy: No  Anticoagulants: No    IPSS 5       SHIM 12       SYSTEMIC THERAPY:     None currently    RADIATION RISK FACTORS AND HISTORY:     Prior radiotherapy: No  Prior systemic therapy: No  Cardiovascular implantable electronic device (CIED): None  Non-cardiac electronic device: No  Collagen vascular disease: No  Inflammatory bowel disease: No  Pregnancy status: Not applicable, patient is male  Prior reaction to IV contrast for imaging: No known prior allergy or reaction to either iodine or gadolinium contrast  Prior issues obtaining MRI: None    REVIEW OF SYSTEMS:     A complete 14 point review of systems was performed and  was negative with the exception of pertinent positives detailed here and in the HPI.    Review of Systems   Constitutional:  Negative for appetite change, fatigue and fever.   HENT:  Negative for hearing loss, sinus pain, sore throat, tinnitus and voice change.    Respiratory:  Negative for wheezing.    Cardiovascular:  Negative for chest pain and palpitations.   Gastrointestinal:  Negative for blood in stool, constipation, diarrhea, nausea and vomiting.   Genitourinary:  Negative for hematuria.   Skin:  Positive for rash.   Neurological:  Negative for dizziness, tremors, seizures, numbness and headaches.   Psychiatric/Behavioral:  Negative for confusion. The patient is not nervous/anxious.    All other systems reviewed and are negative.    PAST MEDICAL AND SURGICAL HISTORY:     Medical History[1]  Past Surgical History[2]     FAMILY HISTORY:     His family history includes Lymphoma (age of onset: 75) in his father. He He indicated that his mother is deceased. He indicated that his father is deceased. He indicated that his sister is alive. He indicated that both of his brothers are alive.    Family History   Problem Relation Age of Onset   . Lymphoma Father 31       SOCIAL HISTORY:     Sherif Millspaugh  reports that he has never smoked. He has never used smokeless tobacco. He reports current alcohol use of about 14.0 standard drinks of alcohol per week. He reports that he does not use drugs.   Social History     Social History Narrative   . Not on file       MEDICATIONS:     Reviewed and updated in EPIC, including:  Current Outpatient Medications   Medication Instructions   . allopurinol (ZYLOPRIM) 300 mg, Daily   . amLODIPine  (NORVASC ) 5 mg, Oral, Daily   . aspirin EC 81 mg, Daily   . atorvastatin  (LIPITOR) 10 mg, Oral, Daily   . glipiZIDE (GLUCOTROL) 10 mg, 2 times daily   . hydroCHLOROthiazide (HYDRODIURIL) 25 mg, Daily   . losartan (COZAAR) 50 MG tablet    . metoprolol succinate (TOPROL-XL) 100 mg, 2 times  daily   . Synjardy XR 12.12-998 MG Tablet SR 24 hr 1 tablet, At bedtime       ALLERGIES:     Allergies[3]    PHYSICAL EXAM:     Vital signs reviewed in EPIC, including:  There were no vitals filed for this visit.  Wt Readings from Last 3 Encounters:   01/23/24 97.1 kg (214 lb)   01/17/24 96.8 kg (213 lb 6.5 oz)   01/02/24 96.6 kg (213 lb)     Pain: 0 out of 10  KPS: 90 - Able to carry normal activity, minor signs or symptoms of disease    General: Well-appearing, pleasant male, in no acute distress.  Psychiatric: Alert and oriented x3, thought content appropriate, euthymic.  Head: Normocephalic, without obvious abnormality.  Eyes: Conjunctivae and sclerae clear without icterus. Extraocular movements intact.  Lungs: Work of breathing appears unlabored.  Skin: Skin color appears normal.     LABORATORY STUDIES:     Pertinent laboratory studies have been reviewed    Complete Blood Count:  No results found for: WBC, HGB, HCT, MCV, PLT, NEUTROABS  Chemistries:  Lab Results   Component Value Date    CREAT 0.92 12/14/2022    BUN 20 12/14/2022    NA 141 12/14/2022    K 4.4 12/14/2022    CL 105 12/14/2022    CO2 30 12/14/2022    GLU 92 12/14/2022    CHOL 117 12/14/2022    TRIG 103 12/14/2022    HDL 46 12/14/2022    LDL 52 12/14/2022    TSH 2.26 02/18/2014     Liver Function Testing:  Lab Results   Component Value Date    ALT 32 12/14/2022    AST 26 12/14/2022    ALKPHOS 55 12/14/2022    BILITOTAL 1.0 12/14/2022    ALB 4.2 12/14/2022     Coagulation Studies:  No results found for: INR, PT, PTT    RADIOLOGY STUDIES:     Pertinent imaging has been reviewed    PATHOLOGY STUDIES:     Pertinent pathology has been reviewed  ______________________________________________________________________    He verbalized his understanding of the recommendations discussed and was in agreement with the plan. All of his questions were answered to the best of our abilities and to his apparent satisfaction. He knows to  contact us  with any questions or concerns. Thank you for allowing us  to participate in the management and care of this patient.                    Zena Miu, MD MPharm        Attending Radiation Oncologist        Department of Advanced Radiation Oncology & Proton Therapy        [facility]        [address]        [place], [place] [address]             T [phone]  F [phone]        [email or web address]       My total visit time for this patient encounter was 46 minutes. This includes time spent preparing to see the patient, performing the exam, counseling patient/family, ordering and reviewing tests, medications and/or procedures, care coordination, and documenting clinical information in the medical record.    This note was generated by a voice recognition system and may contain inherent errors or omissions not intended by the user. Grammatical errors, random word insertions, deletions, pronoun errors and incomplete sentences are occasional consequences of this technology due to software limitations. Not all errors are caught or corrected. If there are questions or concerns about the content of this note or information contained within the body of this dictation they should be addressed directly with the author for clarification.           [1]  Past Medical History:  Diagnosis Date   . Diabetes mellitus (CMS/HCC)     glucose avg 150's   . Elevated PSA    . Gout    . Hand fracture    . Hyperlipidemia    . Hypertension    . RBBB    [2]  Past Surgical History:  Procedure Laterality Date   . ARTHROSCOPY KNEE, MENISCUS REPAIR     . BIOPSY, PROSTATE, TRANSRECTAL ULTRASOUND (TRUS), INTEGRATED MAGNETIC RESONANCE IMAGING (MRI) N/A 01/17/2024    Procedure: BIOPSY, PROSTATE, TRANSRECTAL ULTRASOUND (TRUS), INTEGRATED MAGNETIC RESONANCE IMAGING (MRI);  Surgeon: Hale Quince CROME, MD;  Location: [facility];  Service: Urology;  Laterality: N/A;   . COLONOSCOPY, DIAGNOSTIC  (SCREENING)     . EGD     [3]  Allergies  Allergen Reactions   . Penicillins Other (See Comments)     Junior year in college-1974, unknown reaction

## 2024-02-29 NOTE — Progress Notes (Signed)
 [facility] GU Oncology Nurse Navigator    GU Multi-Disciplinary Clinic Visit Summary:  [facility]    Patient attended the GU multi-disciplinary clinic this morning to discuss treatment options for PROSTATE CANCER.       Patient was accompanied by his spouse.     [x]  Included patient and family members/significant others in education: Patient, Spouse/Significant other    [x]  Assessed for barriers to education: None Identified    [x]  Preferred language: English    [x]  Preferred method of education: verbal instuctions, handouts     [x]  Assistance needed for education: None    [x]  Assessed for Advance Directive: has NO advance directive - not interested in additional information      Nurse navigator met with patient at the end of the appointment to review MDC Visit Summary:    Case was reviewed by the Multi-Disciplinary team at Case Conference. Patient was then seen by Dr. Fonda Dawn,  Dr. Quince Neer, and  Dr. Zena Miu.     -Medical Oncology Recommendation:  not a candidate for ADT at this time. Recommend to recheck PSA.    -Surgical Oncology Recommendation:  surgical candidate for prostatectomy.    -Radiation Oncology Recommendation:  RT candidate; discussed Cyberknife vs Brachytherapy.    -Genetics Counselor Recommendation:  not a candidate at this time.    -Oncology Nurse Navigator Recommendation: ONN met with patient and family prior to clinic for introductions, discuss routine for Multi-D, and educational material for prostate cancer/clinic contact information given to patient.    ONN met again with patient and family following the conclusion of the Multi-D Clinic and discussed all treatment options in depth.  ONN answered all questions to the best of my ability.      Patient states the he/she is considering all options; especially RT.     Patient acknowledged plan and verbalized understanding  to reach out to [employer] with decision. Pt has all contact information for ONN and is aware of importance to reach out to [facility]  with decision and or questions/concerns.      The following appointments have been scheduled for the patient:     [x]  Follow up with Provider: ONN with treatment decision/questions.    The following documents were provided to patient today:     [x]  [facility] - Program Information Flyer  [x]  [facility] Cancer Distress Screening Information Sheet  [x]  [facility] Contact Information  [x]  Us  Too Prostate Cancer Education & Support Group Flyer  [x]  [facility] Oncology Nutrition Services Information    [x] Us  Too Hormone Therapy for Patients with Prostate Cancer    [x]  [facility] - Early Stage Prostate Cancer Patient Guide  [x]  [facility] - Erectile Dysfunction Patient Guide      [x] Information sent via mychart pre-multidisciplinary clinic meeting:  [x]  [facility] Information Packet  [x]  [place] Contact Information  [x]  IPSS/SHIM Questionnaires and Radiation Oncology Intake Survey    [x]  Haematologist (NCCN 2023) Early-Stage Prostate Cancer  [x] ENGLISH      [x]  The ASTRO- Radiation Therapy Treatment for Prostate Cancer  [x]  [facility] Department of Radiation Oncology - Gold Fiducial Markers & Rectal Spacers for Prostate Cancer Treatment  [x]  [facility] Department of Radiation Oncology - Preparation for CT/Simulation for Prostate  [x]  [facility] Department of Radiation Oncology - CyberKnife Treatment Patient Information  [x]  [facility] Department of Radiation Oncology - High Dose Rate (HDR) Brachytherapy  [x]  [employer] - CyberKnife Patient Brochure    [x]   [place] Cancer Genetics Program: Warden/ranger for Prostate Cancer Patients and Their Relatives    [x]  [employer], [facility] - Prostate Cancer: Radical Prostatectomy  [x]  [facility] -Prostatectomy:  What You Should Know    [x]  Decipher Prostate Genomic Test Brochure  [x]  [facility] - Life With Cancer: Sexual Health Resources for Male Cancer Patients        [x]  Case shared with [facility] and Rad/Onc Nurse  navigators, if applicable, for possible future treatment and to assist with needs throughout treatment process and recovery.    [x]  Orders and notes reviewed with attendings.  See new orders placed and scheduled. Routine follow up with patient and family post [facility] clinic offered to patient.    [x]  Digital copy/teaching tools available upon request. Pt given hard copy in clinic's GU-ONC Multidisciplinary Education folder.    Nurse Navigator contact information provided. ONN remains an available resource for this patient and assist with coordination of care as needed.        Rocky Hugger, RN BSN   Oncology Nurse Navigator-GU Malignancies   [facility]  [employer]  [phone]  [email or web address]

## 2024-02-29 NOTE — Progress Notes (Signed)
 Subjective:      Patient ID: Noah Wells is a 73 y.o. male     Chief Complaint:      73 year old male who presents for [facility] Multidisciplinary Clinic today for initial comprehensive evaluation of his prostate cancer.     Here with this wife      IP SS 5, Shim 12      Diagnosis:   Adenocarcinoma of the prostate     Stage:   IIB (cT1c, cN0, cM0, PSA 6.1, Grade Group 2)     Genetic/Molecular Testing:   None     Prior Therapy:   None     Current Therapy:   To be determined     Oncologic Diagnostic History:   - 05/28/2021: PSA 5.0.  - 07/14/2021: PSA 3.75.  - 05/30/2022: PSA 5.2.  - 07/27/2022: PSA 4.57.  - 12/19/2022: PSA 5.01.  - 05/16/2023: PSA 6.7.  - 08/11/2023: PSA 6.1.  - 09/05/2023: MRI prostate with/without contrast revealed a 61cc prostate with a PI-RADS 4 lesion in the left PZ mid gland to base with no e/o ECE, SVI, enlarged nodes, or osseous lesion.   - 12/08/2023: MRI prostate with/without contrast revealed a 56cc prostate with a PI-RADS 4 lesion in the left posterior PZ at the apex with no e/o ECE, enlarged nodes, or suspicious bone lesion.  - 01/17/2024: Prostate biopsy performed. Pathology revealed prostatic acinar adenocarcinoma in 5/15 cores (all left sided + target #1), highest Gleason 3+4=7, Decipher low risk (score 0.37).      PMH - DM, HTN, gout, HLD, prostate cancer  PSH-knee meniscus repair  Medications allopurinol, amlodipine , ASA, Lipitor, hydrochlorothiazide, Cozaar, metoprolol  No known drug allergies     The following portions of the patient's history were reviewed and updated as appropriate: allergies, current medications, past family history, past medical history, past social history, past surgical history and problem list.    Review of Systems  Systems reviewed per the HPI and below:     History obtained from the patient     General ROS: no fevers, or chills     Gastrointestinal ROS: no abdominal pain, change in bowel habits     Musculoskeletal ROS:  no swelling to  lower extremities, no back pain     Objective:   BP 106/57   Pulse 75   Temp 97 F (36.1 C) (Temporal)   Resp 16   Ht 1.88 m (6' 2)   Wt 98.7 kg (217 lb 9.6 oz)   SpO2 100%   BMI 27.94 kg/m    vital signs reviewed  Physical Exam   Constitutional: Pt is oriented to person, place, and time and well-developed, well-nourished, and in no distress.   Pulmonary/Chest: Effort normal.         Lab Review   Reviewed results as listed below. Discussed findings with patient.     As above  Radiology Review   Reviewed results as listed below. Discussed results with the patient and answered questions to the best of my ability.   As above  Assessment:   No diagnosis found.    Patient's clinical history labs and imaging were reviewed in case conference.     I discussed with the patient the following treatment options for his favorable intermediate risk adenocarcinoma of the prostate.         Active Surveillance: This is usually reserved for patients with Gleason scores of 6 or less, clinical stage T1c or T2a, 3  or less cores positive and 50% of any particular core positive. Although the clinical scenario may be amenable to observation if some of these criteria are not applicable. I also advised a repeat biopsy of his prostate in one year and a PSA every 6 months. The risk of observation is progression of the cancer that is not reflected on repeat biopsy or by PSA value.     Radiation therapy treatment options were briefly discussed with the patient.  Patient was seen by radiation oncology today.      Surgical therapy for prostate cancer was also discussed, specifically radical retropubic prostatectomy and robot assisted laparoscopic radical prostatectomy (RALP). My operative experience and rates of incontinence (approximately 10-15% at one year- varies on age of patient and other conditions) and erectile dysfunction (approximately 50% at one year-varies with age of patient and other clinical conditions) were discussed. The  complications of RALP to include but not limited to ED, incontinence, urine leak, lymphocele, shortening of the penis, anejaculation, damage to bowel or other structures, infection, bleeding, DVT, PE, MI, and even death were all discussed.      Hormone therapy for prostate cancer was also discussed and that hormone therapy is the first line for metastatic prostate cancer.  Patient was seen by medical oncology today to discuss androgen deprivation.     I also discussed secondary treatment options. Specifically, if radiation therapy is given first and he has PSA recurrence surgical therapy has much higher side effects (incontinence, ED and rectal injury). If there is adverse pathology from the prostatectomy or there is a  recurrence after prostatectomy then adjuvant/salvage radiation therapy may be used.     Patient to follow-up with our nurse navigator with any unanswered questions.          Plan:   There are no Patient Instructions on file for this visit.    Orders  No orders of the defined types were placed in this encounter.

## 2024-03-05 ENCOUNTER — Telehealth: Payer: Self-pay

## 2024-03-05 LAB — PSA TOTAL, ANNUAL SCREENING: Prostate Specific Antigen, Total: 5.55 ng/mL — ABNORMAL HIGH (ref <=4.00)

## 2024-03-05 NOTE — Telephone Encounter (Signed)
 [facility] GU Oncology Nurse Navigator Telephone Encounter        Follow up post GU-MDC from February 29 2024  Pt requesting follow up appointment to speak with Dr. Chinita regarding Cyberknife.  Pt with questions regarding treatment, timing and possible role of Active Surveillance.  Message sent to front desk Rad Onc to assist in scheduling for ASAP appointment.  Emily tagged in [place].  ONN remains available.                Rocky Hugger, RN BSN   Oncology Nurse Navigator-GU Malignancies   [facility]  [employer]  [phone]  [email or web address]

## 2024-03-07 ENCOUNTER — Inpatient Hospital Stay: Admission: RE | Admit: 2024-03-07 | Payer: Self-pay | Source: Ambulatory Visit | Admitting: Radiation Oncology

## 2024-03-07 ENCOUNTER — Encounter: Payer: Self-pay | Admitting: Radiation Oncology

## 2024-03-07 VITALS — BP 121/67 | HR 66 | Temp 97.7°F | Resp 16 | Wt 217.4 lb

## 2024-03-07 DIAGNOSIS — C61 Malignant neoplasm of prostate: Secondary | ICD-10-CM

## 2024-03-07 NOTE — Progress Notes (Signed)
 03/07/24 1306   Comfort Alteration   Karnofsky Performance Score 100%-normal, no complaints   Fatigue 1   Pain Score 0   Nutrition Alteration   Weight 98.6 kg (217 lb 6.4 oz)   Elimination Alteration   Urinary Frequency, Urgency 0-normal   Urinary Incontinence 0-absent   Urinary Retention 0-absent   Dysuria 0-none   Nocturia # per night 2 x per night   Vital signs   Temp 97.7 F (36.5 C)   Temp src Oral   Heart Rate 66   Resp Rate 16   BP 121/67     IPSS: 4  SHIM: 16

## 2024-03-07 NOTE — Progress Notes (Signed)
 RADIATION ONCOLOGY CLINIC NOTE    Consulting Physicians:   Referring physician or provider: Quince Neer, MD  Primary care physician or provider: Wyatt Evalene BIRCH, MD  Urology: Noah Neer, MD  Medical oncology: Noah Dawn, MD    PCP: Noah Evalene BIRCH, MD    IDENTIFYING HISTORY: Noah Wells is a 73 y.o. 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  He is seen today following MDC visit on 7/17, here to discuss further his radiation options     ONCOLOGIC HISTORY:  - 05/28/2021: PSA 5.0.  - 07/14/2021: PSA 3.75.  - 05/30/2022: PSA 5.2.  - 07/27/2022: PSA 4.57.  - 12/19/2022: PSA 5.01.  - 05/16/2023: PSA 6.7.  - 08/11/2023: PSA 6.1.  - 09/05/2023: MRI prostate with/without contrast revealed a 61cc prostate with a PI-RADS 4 lesion in the left PZ mid gland to base with no e/o ECE, SVI, enlarged nodes, or osseous lesion.   - 12/08/2023: MRI prostate with/without contrast revealed a 56cc prostate with a PI-RADS 4 lesion in the left posterior PZ at the apex with no e/o ECE, enlarged nodes, or suspicious bone lesion.  - 01/17/2024: Prostate biopsy performed. Pathology revealed prostatic acinar adenocarcinoma in 5/15 cores (all left sided + target #1), highest Gleason 3+4=7, Decipher low risk (score 0.37).  -03/04/24: PSA  5.55     INTERVAL HISTORY:   Noah Wells returns today to again discuss management of their cancer. Since our last visit, they have continued to do well, with no intercurrent health issues. Comes prepared today with with a list of questions regarding the reasoning, logistics, outcomes, and expected side effects of radiation, particularly CyberKnife.     PERFORMANCE STATUS:  ECOG: 0  KPS: 100%    Current Medications[1]    Allergies[2]    PHYSICAL EXAMINATION:  Vitals:   Vitals:    03/07/24 1306   BP: 121/67   Pulse: 66   Resp: 16   Temp: 97.7 F (36.5 C)   SpO2: 94%          01/23/2024 02/29/2024 03/07/2024   Weight Monitoring   Height 188 cm 188 cm    188 cm    188 cm    Weight 97.07 kg 98.703 kg    98.703 kg    98.703 kg 98.612 kg   BMI (calculated) 27.5 kg/m2 27.9 kg/m2    27.9 kg/m2    27.9 kg/m2        Multiple values from one day are sorted in reverse-chronological order       PAIN SCORE: 0    General appearance: Well appearing, Comfortable appearing, no acute distress.    General appearance: Well appearing, NAD  Mental status: Awake, AOX4  Eyes: EOMI, no scleral icterus  Neck: FROM  Chest: No increased work of breathing, no cough   Heart: No cyanosis or pallor  Abdomen: Non-distended, flat  Extremities: No gross deformities, moves with full range of motion   Skin: No rashes, no lesions   Neurologic: No focal neurologic deficits     The remainder of the exam was deferred for discussion of treatment and care coordination.     IMAGING STUDIES: The following films and/or reports have been personally reviewed:     MRI Prostate W WO Contrast [[id number]] (Order [id number])   IMPRESSION:      1.  There is a PIRADS 4 lesion in  the left posterior peripheral zone at  the apex.  2.  Prostate volume 56 cc.  3.  No evidence for extracapsular extension.  4.  Overall PI-RADS score: 4.     PI-RADS SCALE version 2.1:  1: Very low (clinically significant cancer is highly unlikely to be  present)  2: Low (clinically significant cancer is unlikely to be present)  3: Indeterminate (the presence of clinically significant cancer is  equivocal)  4: High (clinically significant cancer is likely to be present)  5: Very high (clinically significant cancer is highly likely to be present)  X: (component of exam technically inadequate or not performed)    ASSESSMENT:    [name] is a 73 year old man with favorable intermediate risk prostate cancer (cT1 cN0 M0, PSA 6.1, Gleason 3+4 in 4/14 standard cores and the lesional core, decipher 0.37, who presents today for further discussion of his treatment options  with regard to radiation therapy. A detailed discussion of the concept of active surveillance was had, in which it was explained that given his risk stratification in addition to his low risk Decipher score, it is reasonable to pursue active surveillance and that there are no features of his disease which specifically make this approach contra-indicated, and that the decision for active surveillance is a personal one which takes into account each individuals specific risk tolerance. However, a caveat to this discussion was that the majority of patients who pursue active surveillance have Gleason 3+3, and in the ProtecT Trial, this constituted 77.2% of study participants. Noah Wells has slightly higher risk with 3+4. Thus, while active surveillance is reasonable, it does not come without some risk.     We again reviewed the side effects of radiation which include urinary symptoms including frequency, urgency, hesitancy, and dysuria, as well as symptoms of mild fatigue, hemorrhoid exacerbation or mild proctitis, and bowel urgency/frequency.  Long term complications are rare but include rectal bleeding or cystitis requiring intervention, often with requiring laser therapy. Erectile dysfunction is possible in men after IMRT. This occurs in approximately 40-50% of potent men after 3-5 years and has rates that are similar to that of surgery. It was explained that care is taken to avoid damage to non-target radiosensitive structures such as the urethra and the rectum by placing a Foley catheter during planning to delineate the urethra from surrounding prostate and the placement of a rectal spacer during treatment, respectively.     CyberKnife/extreme hypofractionated radiotherapy, was again discussed in greater detail. It was explained that this involves treatment to the whole prostate, which can be delivered with or without boost to the tumor itself. Urinary side effects can however be more pronounced. The logistics of  CyberKnife include delivering 5 fractions every other day over a 10 day period. Side effects tend to manifest about 1 week after therapy, at which time a member of our nursing team will reach out to the patient to assess and coordinate follow up. As per previous discussion, ADT is not required for favorable intermediate risk disease. Overall, with Cyberknife alone, patient is expected to have excellent 10 year biochemical control, with an estimated risk of biochemical recurrence of about 10% and life time risk of prostate cancer specific mortality of 5%.     In the end, Mr. Ringler would like to proceed with CyberKnife     Please see my initial dictation for a full discussion of the indications, risks, benefits and logistics of treatment. As before, I recommend CyberKnife . The  patient was in agreement with this plan, and therefore consent was reviewed, signed and placed into the chart.    Disease Control: NA    Side Effects: NA    Other Issues: Diabetes,     Pain action plan: Begin planning for CyberKnife in 5 fractions every other day over 10 day period, to start as soon as possible     PLAN:  As above     My total visit time for this patient encounter was 60 min. This includes time spent preparing to see the patient, performing the exam, counselling patient/family, ordering and reviewing tests, medications and/or procedures, care coordination, and documenting clinical information in the record.    New Patient EM  CPT MDM Total time   00798 CODE DELETED CODE DELETED   99202 Straightforward 15-29   99202 Low 30-44   99204 Moderate 45-59   99205 High 60-74      Established Patient EM  CPT MDM Total time    99211   No time component   99212 Straightforward 10-19   99213 Low 20-29   99214 Moderate 30-39   99215 High 40-54     Aleene Shape, MD PGY-5  Hematology/Oncology Fellow   [facility]     Note is not official without attestation and signature of attending      Zena SAUNDERS. Chinita, MD,  MPharmacol.  Attending Radiation Oncologist and Medical Director  [place] [facility]  [address]  [place], [place] [address]  T [phone] [facility]) T [phone] [facility] F [phone]  [employer]  [facility]             [1]   Current Outpatient Medications:     allopurinol (ZYLOPRIM) 300 MG tablet, Take 1 tablet (300 mg) by mouth once daily, Disp: , Rfl:     amLODIPine  (NORVASC ) 5 MG tablet, Take 1 tablet (5 mg) by mouth daily, Disp: 90 tablet, Rfl: 3    aspirin EC 81 MG EC tablet, Take 1 tablet (81 mg) by mouth once daily (Patient not taking: Reported on 02/29/2024), Disp: , Rfl:     atorvastatin  (Lipitor) 10 MG tablet, Take 1 tablet (10 mg) by mouth once daily, Disp: 90 tablet, Rfl: 3    glipiZIDE (GLUCOTROL) 10 MG 24 hr tablet, Take 1 tablet (10 mg) by mouth 2 (two) times daily, Disp: , Rfl:     hydrochlorothiazide (HYDRODIURIL) 25 MG tablet, Take 1 tablet (25 mg) by mouth once daily, Disp: , Rfl: 0    losartan (COZAAR) 50 MG tablet, , Disp: , Rfl:     metoprolol (TOPROL-XL) 100 MG 24 hr tablet, Take 1 tablet (100 mg) by mouth 2 (two) times daily, Disp: , Rfl:     Synjardy XR 12.12-998 MG Tablet SR 24 hr, Take 1 tablet by mouth once nightly (Patient taking differently: Take 2 tablets by mouth once at bedtime), Disp: , Rfl:   [2]   Allergies  Allergen Reactions    Penicillins Other (See Comments)     Junior year in college-1973, unknown reaction

## 2024-03-12 ENCOUNTER — Telehealth: Payer: Self-pay | Admitting: Radiation Oncology

## 2024-03-12 NOTE — Telephone Encounter (Signed)
 Good afternoon Mr. Shepard, Keltz have been confirmed for the following schedule:    CT Scan: Mon., 04/22/24 at 11AM  MRI Scan: Mon., 04/22/24 at 12PM      The location is [facility], [address], [place], [place] [address]. Enter through [employer]. Please note that one of our nurses will contact you shortly for patient education.       Please inform me if you need additional information or need to adjust your schedule via email or calling me at [phone]. Please disregard appointments in MyChart for Radiation Oncology; it doesn't reflect "real time appointments". While we do our best to accommodate our patients please keep in mind: The appointment(s) listed above may be changed to an earlier or later, date or time, if so, you will be notified via telephone.             Best regards,        [place] -  Oncology Patient Specialist III   Department of Advanced Radiation Oncology and Proton Therapy  [facility]    [address]  [place], [place] [address]  M: [phone]  O: [phone]  F: [phone]     -[facility]'s service promise: We seek every opportunity to meet the unique needs of each person we are privileged to serve - every time, every touch. [facility]

## 2024-03-14 ENCOUNTER — Other Ambulatory Visit: Payer: Self-pay

## 2024-03-14 NOTE — Progress Notes (Signed)
 Rectal Spacer and Prostate Fiducial Markers   Patient Instructions    [facility] Radiation Oncology    Your fiducial and rectal spacer placements have been scheduled for Wednesday, April 10, 2024 at 9:30am.     [facility] Radiation Oncology Department  [address]  [place], [place] [address]   [phone]    On the day of procedure, you will be asked to report to the [facility] Radiation Oncology Department 1 hour prior to your scheduled time. You may not drive or take a cab home after this procedure. Please bring a reliable driver with you on the day of procedure.       Preparation:    Tests to be completed prior to procedure:  Please see [facility]) for medical clearance  Hold blood thinners for about 10 days, check with your prescribing physician.  (Aspirin, Motrin/Ibuprofen, Aleve, Advil, etc.)    Bowel Prep Supplies    A bowel prep is required prior to your procedure. You will need to obtain 2 Fleets enemas and EITHER one bottle of Magnesium Citrate OR a container of Miralax from your local drug store. No prescription is required. Magnesium citrate has been on back order due to supply shortages and is difficult to find, which is why we provide alternative instructions with Miralax. You do not need both.     You will be restricted to clear liquids only starting 4 pm the day before your procedure until 3 hours before your procedure start time. Clear liquids:  water, apple juice, gatorade, black coffee/tea WITHOUT CREAM OR MILK, sugar only.       Instructions for Day Before and Day of Procedure    Day Prior to procedure:  At 4 pm, start a CLEAR LIQUID DIET. No solid food after 4 pm.   If you have Magnesium citrate: at 4 pm, drink one bottle of Magnesium Citrate.  If you have Miralax, take 1 dose at 10 am and one dose in at 4 pm  At 8 pm, perform one Fleets enema   You may continue taking any usual daily medications.       Morning of  procedure:  Perform a second Fleets enema 2-3 hours before your procedure time  You must stop clear liquids 3 hours before your procedure start time.   You may take your regular daily medications with small sips of water.     At the Office:  Anesthesia is used for the procedure. An anesthesiologist will meet with you and review your medical history prior to procedure. You will have an IV started before the procedure.    After the procedure:  You will be monitored by recovery nurses as you come out of anesthesia.     Post-operative Instructions    General Instructions:    Following sedation your judgement, perception and coordination are considered impaired.  Even though you may feel awake and alert, you are considered legally intoxicated.  Therefore, until the next morning,  Do not drive.  Do not operate appliances or equipment that requires reaction time (e.g., stove, electrical tools, machinery).  Do not smoke if alone.  Do not drink alcoholic beverages.  Go directly home and rest for several hours before resuming your routine activities.  It is highly recommended to have a responsible adult stay with you for the next 24 hours.    DIET:    Regular, unless you are on a special diet for other reasons  ACTIVITY:    Avoid heavy lifting or strenuous physical activity for the first two days after procedure. You may then return to your normal activity level.    POSSIBLE SIDE EFFECTS:    Tenderness, swelling or pain may occur at the IV site where you received sedation.  If you experience this, apply warm soaks to the area.  Notify your physician if this persists.    There are very few side effects from the procedure; however, slight burning on urination, urinating more frequently, blood in the ejaculate or soreness at the insertion site in the perineal area could occur.  These symptoms could last for up to a week.  Many patients also report a feeling of fullness or an unusual (not painful or uncomfortable) sensation  in the rectum during bowel movements for the first week or so, which is the body adjusting the the spacer presence.     You are likely to see blood in your urine after the procedure for the first twenty-four hours. If after twenty-four hours visible blood persists, or if you begin to pass blood clots, you should contact your radiation oncologist.     More rarely, the gel can infiltrate into the surrounding structures, like the prostate or the rectum. If there is only a small amount of infiltration, no intervention is usually needed (the gel is absorbed/degraded by the body), but in rare cases where there is significant infiltration, more investigation can be needed with dedicated MRI and/or colonoscopy. And this can lead to a delay in starting radiation.    Call your healthcare provider right away if you have:  Increasing pain or pain that doesn't get better after taking over-the-counter pain medication   A fever of 100.4 F (38 C) or higher   Chills   Trouble urinating   Blood in your stool (poop) or urine (pee)   Dizziness    FOLLOW-UP:      You will return to the radiation oncology office for your simulation within about a week of the spacer/ fiducials placement procedure. If you do not have that appointment, please notify the scheduler.     If you have any questions/concerns, please call [phone] [facility] [place]) [phone] [place]) in case of emergency after hours, the Radiation Oncology Physician on call can be paged by calling [phone].

## 2024-03-15 ENCOUNTER — Ambulatory Visit
Admission: RE | Admit: 2024-03-15 | Discharge: 2024-03-15 | Disposition: A | Discharge status: Home / Self Care | Source: Ambulatory Visit | Attending: Urology | Admitting: Urology

## 2024-03-15 DIAGNOSIS — C61 Malignant neoplasm of prostate: Secondary | ICD-10-CM | POA: Insufficient documentation

## 2024-03-15 DIAGNOSIS — Z9889 Other specified postprocedural states: Secondary | ICD-10-CM | POA: Insufficient documentation

## 2024-03-18 ENCOUNTER — Other Ambulatory Visit: Payer: Self-pay | Admitting: Internal Medicine

## 2024-03-18 DIAGNOSIS — C61 Malignant neoplasm of prostate: Secondary | ICD-10-CM

## 2024-03-19 ENCOUNTER — Telehealth: Payer: Self-pay | Admitting: Radiation Oncology

## 2024-03-19 NOTE — Telephone Encounter (Signed)
 Good afternoon Mr. Noah, Wells have been confirmed for the following revised schedule:    CT Scan: Tue., 04/16/24 at 11AM  MRI Scan: Tue., 04/16/24 at 12PM      The location is [facility], [address], [address], [place], [place] [address]. Enter through [employer]. Please note that one of our nurses will contact you shortly for patient education.       Please inform me if you need additional information or need to adjust your schedule via email or calling me at [phone]. Please disregard appointments in MyChart for Radiation Oncology; it doesn't reflect "real time appointments". While we do our best to accommodate our patients please keep in mind: The appointment(s) listed above may be changed to an earlier or later, date or time, if so, you will be notified via telephone.         Best regards,        [place] -  Oncology Patient Specialist III   Department of Advanced Radiation Oncology and Proton Therapy  [facility]    [address]  [place], [place] [address]  M: [phone]  O: [phone]  F: [phone]     -Noah Wells's service promise: We seek every opportunity to meet the unique needs of each person we are privileged to serve - every time, every touch. [facility]

## 2024-03-25 ENCOUNTER — Ambulatory Visit (FREE_STANDING_LABORATORY_FACILITY)

## 2024-03-25 ENCOUNTER — Encounter (INDEPENDENT_AMBULATORY_CARE_PROVIDER_SITE_OTHER): Payer: Self-pay

## 2024-03-25 VITALS — BP 115/67 | HR 65 | Ht 74.0 in | Wt 214.0 lb

## 2024-03-25 DIAGNOSIS — I10 Essential (primary) hypertension: Secondary | ICD-10-CM

## 2024-03-25 DIAGNOSIS — E119 Type 2 diabetes mellitus without complications: Secondary | ICD-10-CM

## 2024-03-25 DIAGNOSIS — I712 Thoracic aortic aneurysm, without rupture, unspecified: Secondary | ICD-10-CM

## 2024-03-25 DIAGNOSIS — I2510 Atherosclerotic heart disease of native coronary artery without angina pectoris: Secondary | ICD-10-CM

## 2024-03-25 DIAGNOSIS — C61 Malignant neoplasm of prostate: Secondary | ICD-10-CM

## 2024-03-25 DIAGNOSIS — Z01818 Encounter for other preprocedural examination: Secondary | ICD-10-CM

## 2024-03-25 LAB — BASIC METABOLIC PANEL
Anion Gap: 5 (ref 5.0–15.0)
BUN: 23 mg/dL (ref 9–28)
CO2: 26 meq/L (ref 17–29)
Calcium: 9.4 mg/dL (ref 7.9–10.2)
Chloride: 105 meq/L (ref 99–111)
Creatinine: 0.9 mg/dL (ref 0.5–1.5)
GFR: >60 mL/min/1.73 m2 (ref >=60.0)
Glucose: 111 mg/dL — ABNORMAL HIGH (ref 70–100)
Hemolysis Index: 9 {index}
Potassium: 4.1 meq/L (ref 3.5–5.3)
Sodium: 136 meq/L (ref 135–145)

## 2024-03-25 LAB — CBC
Absolute nRBC: 0 x10 3/uL (ref <=0.00)
Hematocrit: 43.9 % (ref 37.6–49.6)
Hemoglobin: 15.3 g/dL (ref 12.5–17.1)
MCH: 32.1 pg (ref 25.1–33.5)
MCHC: 34.9 g/dL (ref 31.5–35.8)
MCV: 92 fL (ref 78.0–96.0)
MPV: 11.1 fL (ref 8.9–12.5)
Platelet Count: 171 x10 3/uL (ref 142–346)
RBC: 4.77 x10 6/uL (ref 4.20–5.90)
RDW: 13 % (ref 11–15)
WBC: 7.62 x10 3/uL (ref 3.10–9.50)
nRBC %: 0 /100{WBCs} (ref <=0.0)

## 2024-03-25 LAB — HEMOGLOBIN A1C
Average Estimated Glucose: 174.3 mg/dL
Hemoglobin A1C: 7.7 % — ABNORMAL HIGH (ref 4.6–5.6)

## 2024-03-25 LAB — LAB USE ONLY - GOLD SST HOLD TUBE

## 2024-03-25 NOTE — PEC In-Person Visit (H&P) (Signed)
 Pre-Anesthesia Evaluation     Pre-op Interview visit requested by: Luke Sieving, MD  Reason for pre-op interview visit: FIDUCIAL PLACEMENT         History of Present Illness/Summary:  Patient presents to the [facility] clinic for a pre-operative evaluation.    Assessment/Plan:    1.  Encounter for pre-operative examination [Z01.818]    Orders Placed This Encounter   Procedures    Basic Metabolic Panel    Pre-Procedure Anemia Evaluation (Order)    Hemoglobin A1C    CBC without Differential    Gold SST Hold Tube       2.  Surgical Diagnosis:  Prostate Cancer    3.  T2DM  - Monitored and managed by PCP  - Last A1C 7.1 on 11/22/23  - Currently taking synjardy, glipizide  - Will update A1C today for further evaluation     Addendum 03/28/2024:   - Repeat A1C 7.8. Recommend patient follow up with PCP for further management as A1C is trending upward.     4.  HTN  5. RBBB  - Monitored and managed by PCP/Cardiology  - Well controlled on current regimen  - Will obtain BMP for kidney function and electrolytes     Addendum 03/28/2024:   - Normal renal function and electrolytes on BMP drawn in clinic.    6. Thoracic aortic aneurysm   - Followed by Cardiology with regular surveillance imaging  - An echocardiogram on November 21, 2023, shows an ascending aorta measuring 3.7 cm, consistent with previous findings from Dec 22, 2022.   - Stress testing     7. CAD  - Scattered coronary calcifications seen on CT angiogram Chest 02/18/14  - Negative exercise stress testing in 2015.  - Patient taking ASA and Statin daily  - He denies CP, SOB, DOE, PND, or othopnea.  - METS 5.72, DASI 24/2      Patient is at elevated risk for perioperative CV complications 2/2 HTN, RBBB, Aortic aneurysm, CAD, however, not prohibitive to proceeding with planned surgery or procedure.       Patient voiced understanding of all instructions.  All questions and concerns addressed at this time. This assessment will be conveyed to the surgery and anesthesiology teams & the patient  will be evaluated the morning of surgery.      Problem List:  Medical Problems       Hospital Problem List  Date Reviewed: 03/25/2024   None        Non-Hospital Problem List  Date Reviewed: 03/25/2024          ICD-10-CM Priority Class Noted Diagnosed    Diabetes mellitus (CMS/HCC) E11.9   Unknown     Hypertension I10   Unknown     Hyperlipidemia E78.5   Unknown     Thoracic aortic aneurysm without rupture, unspecified part I71.20   01/03/2023     Prostate cancer (CMS/HCC) C61   02/29/2024     Coronary artery disease involving native coronary artery of native heart without angina pectoris I25.10   03/25/2024         Medical History   Diagnosis Date    Diabetes mellitus (CMS/HCC)     glucose avg 150's    Gout     Hand fracture     Hyperlipidemia     Hypertension     Prostate cancer (CMS/HCC)     RBBB      Past Surgical History[1]     Medication List  Accurate as of March 25, 2024 11:59 PM. Always use your most recent med list.                allopurinol 300 MG tablet  Take 1 tablet (300 mg) by mouth once daily  Commonly known as: ZYLOPRIM  Medication Adjustments for Surgery: Take as prescribed     amLODIPine  5 MG tablet  Take 1 tablet (5 mg) by mouth daily  Commonly known as: NORVASC   Medication Adjustments for Surgery: Take as prescribed     aspirin EC 81 MG EC tablet  Take 1 tablet (81 mg) by mouth once daily  Medication Adjustments for Surgery: Take as prescribed     atorvastatin  10 MG tablet  Take 1 tablet (10 mg) by mouth once daily  Commonly known as: Lipitor  Medication Adjustments for Surgery: Take as prescribed     glipiZIDE 10 MG 24 hr tablet  Take 1 tablet (10 mg) by mouth 2 (two) times daily  Commonly known as: GLUCOTROL  Medication Adjustments for Surgery: Take as prescribed     hydroCHLOROthiazide 25 MG tablet  Take 1 tablet (25 mg) by mouth once daily  Commonly known as: HYDRODIURIL  Medication Adjustments for Surgery: Take as prescribed     loratadine 10 MG tablet  Take 1 tablet (10 mg) by mouth  once daily  Commonly known as: CLARITIN  Medication Adjustments for Surgery: Take as needed     losartan 50 MG tablet  Commonly known as: COZAAR  Medication Adjustments for Surgery: Take as prescribed     metoprolol succinate 100 MG 24 hr tablet  Take 1 tablet (100 mg) by mouth 2 (two) times daily  Commonly known as: TOPROL-XL  Medication Adjustments for Surgery: Take as prescribed     Synjardy 12.12-998 MG Tabs  Take 2 tablets by mouth once daily  Generic drug: Empagliflozin-metFORMIN HCl  Medication Adjustments for Surgery: Stop 3 days before surgery            Allergies[2]  Social History     Occupational History    Not on file   Tobacco Use    Smoking status: Never    Smokeless tobacco: Never   Vaping Use    Vaping status: Never Used   Substance and Sexual Activity    Alcohol use: Yes     Alcohol/week: 14.0 standard drinks of alcohol     Types: 14 Glasses of wine per week    Drug use: No    Sexual activity: Not on file           Exam Scores:   SDB score  OSA Risk Category: No Risk        STBUR score       PONV score  Nausea Risk: LOW RISK    MST score  MST Score: 0    PEN-FAST score       Frailty score       MICA  MICA %: 0.24    RCRI score  RCRI Score: 1    DASI  DASI Score: 24.2  METs Level: 5.72    EA-DIVA       CHADsVasc            Visit Vitals  BP 115/67   Pulse 65   Ht 1.88 m (6' 2)   Wt 97.1 kg (214 lb)   SpO2 100%   BMI 27.48 kg/m   Overweight based on BMI.    Review of Systems   Constitutional:  Negative.    HENT: Negative.     Eyes: Negative.    Respiratory: Negative.     Cardiovascular: Negative.    Gastrointestinal: Negative.    Genitourinary: Negative.    Musculoskeletal: Negative.    Skin: Negative.    Neurological: Negative.    Endo/Heme/Allergies: Negative.    Psychiatric/Behavioral: Negative.         Physical Exam:  Mallampati: III  TM distance: > 3 FB (> 6 cm)  Mouth opening: > 3 FB (> 6 cm)  Neck extension: full  Upper lip bite assessment: class I  (-) enlarged neck circumference    Dentral  drawing normal.    Mental status: alert and oriented x3  Speech: normal    Heart rhythm: regular  no murmur:    (-) a murmur and peripheral edema    Breath sounds clear to auscultation bilaterally            Recent Labs   CBC (last 180 days) 03/25/24  1351   WBC 7.62   RBC 4.77   Hemoglobin 15.3   Hematocrit 43.9   MCV 92.0   MCH 32.1   MCHC 34.9   RDW 13   Platelet Count 171   MPV 11.1   nRBC % 0.0   Absolute nRBC 0.00     Recent Labs   BMP (last 180 days) 03/25/24  1351   Glucose 111*   BUN 23   Creatinine 0.9   Sodium 136   Potassium 4.1   Chloride 105   CO2 26   Calcium  9.4   Anion Gap 5.0   GFR >60.0         Recent Labs   Other (last 180 days) 03/25/24  1351   Hemoglobin A1C 7.7*             Anesthesia Plan:  ASA 3   Anesthetic Options Discussed: General and MAC  Potential anesthesia/Perioperative problems: Increased Risk of Cardiac Event    DNR status: Patient or surrogate requests that DNR status remain full code.  Discussed DNR status with: Patient      A discussion with regarding next steps to prepare for the procedure and the planned anesthesia care took place during today's visit.  I explained that the patient will meet with their anesthesiology providers on the DOS.  Discussed with Patient      Acceptability of blood products: Accepted  Use of blood products discussed with Patient                                                               [1]   Past Surgical History:  Procedure Laterality Date    ARTHROSCOPY KNEE, MENISCUS REPAIR      BIOPSY, PROSTATE, TRANSRECTAL ULTRASOUND (TRUS), INTEGRATED MAGNETIC RESONANCE IMAGING (MRI) N/A 01/17/2024    Procedure: BIOPSY, PROSTATE, TRANSRECTAL ULTRASOUND (TRUS), INTEGRATED MAGNETIC RESONANCE IMAGING (MRI);  Surgeon: Hale Quince CROME, MD;  Location: [facility];  Service: Urology;  Laterality: N/A;    COLONOSCOPY, DIAGNOSTIC (SCREENING)      EGD     [2]   Allergies  Allergen Reactions    Penicillins Other (See Comments)     Junior year in  college-1974, unknown reaction

## 2024-04-05 NOTE — Progress Notes (Signed)
 Called and confirmed with Mr. Gustafson of Wednesday [facility] & [facility]) appointment.  Patient has reviewed  and understood the instruction.  Jaynie ONEIDA Miyamoto, RN

## 2024-04-09 NOTE — Progress Notes (Signed)
 Post-operative Instructions     General Instructions:     Following sedation your judgement, perception and coordination are considered impaired.  Even though you may feel awake and alert, you are considered legally intoxicated.  Therefore, until the next morning,  Do not drive.  Do not operate appliances or equipment that requires reaction time (e.g., stove, electrical tools, machinery).  Do not smoke if alone.  Do not drink alcoholic beverages.  Go directly home and rest for several hours before resuming your routine activities.  It is highly recommended to have a responsible adult stay with you for the next 24 hours.     DIET:     Regular, unless you are on a special diet for other reasons     ACTIVITY:     Avoid heavy lifting or strenuous physical activity for the first two days after procedure. You may then return to your normal activity level.     POSSIBLE SIDE EFFECTS:     Tenderness, swelling or pain may occur at the IV site where you received sedation.  If you experience this, apply warm soaks to the area.  Notify your physician if this persists.     There are very few side effects from the procedure; however, slight burning on urination, urinating more frequently, blood in the ejaculate or soreness at the insertion site in the perineal area could occur.  These symptoms could last for up to a week.  Many patients also report a feeling of fullness or an unusual (not painful or uncomfortable) sensation in the rectum during bowel movements for the first week or so, which is the body adjusting the the spacer presence.      You are likely to see blood in your urine after the procedure for the first twenty-four hours. If after twenty-four hours visible blood persists, or if you begin to pass blood clots, you should contact your radiation oncologist.      More rarely, the gel can infiltrate into the surrounding structures, like the prostate or the rectum. If there is only a small amount of infiltration, no  intervention is usually needed (the gel is absorbed/degraded by the body), but in rare cases where there is significant infiltration, more investigation can be needed with dedicated MRI and/or colonoscopy. And this can lead to a delay in starting radiation.     Call your healthcare provider right away if you have:  Increasing pain or pain that doesn't get better after taking over-the-counter pain medication   A fever of 100.4 F (38 C) or higher   Chills   Trouble urinating   Blood in your stool (poop) or urine (pee)   Dizziness     FOLLOW-UP:       You will return to the radiation oncology office for your simulation within about a week of the spacer/ fiducials placement procedure. If you do not have that appointment, please notify the scheduler.      If you have any questions/concerns, please call [phone] [facility]) [phone] [place]) in case of emergency after hours, the Radiation Oncology Physician on call can be paged by calling [phone]      Your CT Simulation and MRI Simulation are scheduled for Tuesday, April 16, 2024 at 11:00am at [employer].

## 2024-04-10 ENCOUNTER — Encounter: Payer: Self-pay | Admitting: Internal Medicine

## 2024-04-10 ENCOUNTER — Ambulatory Visit: Payer: Self-pay

## 2024-04-10 ENCOUNTER — Ambulatory Visit: Admission: RE | Admit: 2024-04-10 | Source: Ambulatory Visit | Admitting: Internal Medicine

## 2024-04-10 VITALS — BP 121/64 | HR 58 | Temp 97.6°F | Resp 13

## 2024-04-10 DIAGNOSIS — C61 Malignant neoplasm of prostate: Secondary | ICD-10-CM

## 2024-04-10 LAB — WHOLE BLOOD GLUCOSE POCT
Whole Blood Glucose POCT: 169 mg/dL — ABNORMAL HIGH (ref 70–100)
Whole Blood Glucose POCT: 143 mg/dL — ABNORMAL HIGH (ref 70–100)

## 2024-04-10 MED ORDER — PROPOFOL INFUSION 10 MG/ML
INTRAVENOUS | Status: DC | PRN
Start: 2024-04-10 — End: 2024-04-10
  Administered 2024-04-10: 140 mg via INTRAVENOUS

## 2024-04-10 MED ORDER — ONDANSETRON HCL 4 MG/2ML IJ SOLN
INTRAMUSCULAR | Status: DC | PRN
Start: 2024-04-10 — End: 2024-04-10
  Administered 2024-04-10: 4 mg via INTRAVENOUS

## 2024-04-10 MED ORDER — SODIUM CHLORIDE 0.9 % IV SOLN
INTRAVENOUS | Status: DC | PRN
Start: 2024-04-10 — End: 2024-04-10

## 2024-04-10 MED ORDER — LIDOCAINE HCL 1 % IJ SOLN
INTRAMUSCULAR | Status: AC
Start: 2024-04-10 — End: 2024-04-10
  Filled 2024-04-10: qty 20

## 2024-04-10 MED ORDER — CLINDAMYCIN PHOSPHATE IN D5W 600 MG/50ML IV SOLN
600.0000 mg | Freq: Once | INTRAVENOUS | Status: AC
Start: 2024-04-10 — End: ?
  Filled 2024-04-10: qty 50

## 2024-04-10 MED ORDER — FENTANYL CITRATE (PF) 50 MCG/ML IJ SOLN (WRAP)
INTRAMUSCULAR | Status: DC | PRN
Start: 2024-04-10 — End: 2024-04-10
  Administered 2024-04-10: 50 ug via INTRAVENOUS

## 2024-04-10 MED ORDER — GLYCOPYRROLATE 0.2 MG/ML IJ SOLN (WRAP)
INTRAMUSCULAR | Status: DC | PRN
Start: 2024-04-10 — End: 2024-04-10
  Administered 2024-04-10: .2 mg via INTRAVENOUS

## 2024-04-10 MED ORDER — LIDOCAINE HCL (PF) 1 % IJ SOLN
INTRAMUSCULAR | Status: DC | PRN
Start: 2024-04-10 — End: 2024-04-10
  Administered 2024-04-10: 50 mg via INTRAVENOUS

## 2024-04-10 MED ORDER — CLINDAMYCIN PHOSPHATE IN D5W 600 MG/50ML IV SOLN
600.0000 mg | Freq: Three times a day (TID) | INTRAVENOUS | Status: DC
Start: 2024-04-10 — End: 2024-04-10
  Administered 2024-04-10: 600 mg via INTRAVENOUS

## 2024-04-10 MED ORDER — STERILE WATER FOR INJECTION IJ/IV SOLN (WRAP)
2.0000 g | Status: AC
Start: 2024-04-10 — End: ?
  Filled 2024-04-10: qty 2000

## 2024-04-10 MED ORDER — FENTANYL CITRATE (PF) 50 MCG/ML IJ SOLN (WRAP)
INTRAMUSCULAR | Status: AC
Start: 2024-04-10 — End: 2024-04-10
  Filled 2024-04-10: qty 2

## 2024-04-10 NOTE — Progress Notes (Signed)
 Please give clindamycin , not Ancef .

## 2024-04-10 NOTE — Progress Notes (Signed)
 04/10/24 0831   Comfort Alteration   Pain Score 0   Vital signs   Temp 97.7 F (36.5 C)   Temp src Oral   Heart Rate (!) 57   Resp Rate 16   BP 123/76   OTHER   Fall Risk LOW     Patient arrived for Fiducial markers & Rectal Spacer placement with his wife [phone]).  Verified patient name, dob & allergy (PCN).  Last solid foods was around 1 PM yesterday.  NPO since midnight;  no medication this morning, Empagliflowzin-metformin was on hold since last Friday as instruction.   Bowel prep as prescribed, no issue per patient.  Patient is on continuous glucose monitoring, last reading around 8 AM was 151, according to Mrs. Fairhurst.  Jaynie ONEIDA Miyamoto, RN  Patient has allergy to PCN, pharmacist Arzella) recommended Clindamycin  600 mg.  Notified Dr. Luke. Jaynie ONEIDA Miyamoto, RN

## 2024-04-10 NOTE — Anesthesia Preprocedure Evaluation (Signed)
 Anesthesia Evaluation    AIRWAY    Mallampati: II    TM distance: >3 FB  Neck ROM: full  Mouth Opening:full  Planned to use difficult airway equipment: No CARDIOVASCULAR    cardiovascular exam normal, regular and normal       DENTAL    no notable dental hx               PULMONARY    pulmonary exam normal and clear to auscultation     OTHER FINDINGS    No loose teeth  Allergies Reviewed  Adequately NPO  No FH or personal hx of anesthetic complications  No cough, cold, flu, fever or other URI sx over the last 2 wks  No Acid Reflux/GERD                                      Relevant Problems   CARDIO   (+) Coronary artery disease involving native coronary artery of native heart without angina pectoris   (+) Hypertension   (+) Thoracic aortic aneurysm without rupture, unspecified part       PSS Anesthesia Comments:          Anesthesia Plan    ASA 3     general               (Appropriately NPO.    Past Medical History:  No date: Diabetes mellitus (CMS/HCC)      Comment:  glucose avg 150's  No date: Gout  No date: Hand fracture  No date: Hyperlipidemia  No date: Hypertension  No date: Prostate cancer (CMS/HCC)  No date: RBBB    Lab Results       Component                Value               Date                       WBC                      7.62                03/25/2024                 HGB                      15.3                03/25/2024                 HCT                      43.9                03/25/2024                 MCV                      92.0                03/25/2024                 PLT  171                 03/25/2024              No results found for: COVID    Discussed with patient GA with LMA/ETT as indicated with risk to include but not limited to N/V, H/A, sore throat.  Questions answered.     )      intravenous induction   Detailed anesthesia plan: general LMA and general endotracheal  Monitors/Adjuncts: other (BP cuff, pulse oximeter, EKG)    Post Op: other (PACU)    Post op  pain management: per surgeon    informed consent obtained    Plan discussed with CRNA.      pertinent labs reviewed               Signed by: Donnice Lard, MD 04/10/24 9:06 AM

## 2024-04-10 NOTE — Progress Notes (Signed)
 Rectal Spacer/ Prostate Fiducial Marker Nursing Note:    The patient was brought to the HDR suite and identified by name, dob, MRN.  The Prostate Fiducial Marker/Rectal spacer placement procedure was explained to the patient and consent obtained in preop.  The patient was placed on the exam table and placed under anesthesia.   The patient was placed in stirrups and legs were raised to a position of high abduction for ultrasound imaging.  Perineum/ rectal area was cleaned sterilely with betadine.  Dr. Luke inserted 20 cc of 1% lidocaine  intradermally and subcutaneously into the perineum. 1 Platinum Visicoil 0.75 mm fiducial markers (2 Twin Line) REF [id number], Lot #[id number] exp 10/03/23 and 1 Platinum Visicoil 0.75 mm fiducial markers (2 Twin Line) REF [id number], Lot # [id number] exp 10/13/27 and Bioprotect SN # [id number] exp 08/07/2025 were placed by Dr. Luke using US  guidance.  Incision was closed using Dermabond and steri-strips.    Transferred care to recovery nurses after procedure.    Paper instructions for prostate sim/treatment and side effects were given to patient.  C.Oza Oberle,RN

## 2024-04-10 NOTE — Addendum Note (Signed)
 Encounter addended by: Manuelita Waddell LABOR, RPh on: 04/10/2024 5:14 PM   Actions taken: Order list changed

## 2024-04-10 NOTE — Anesthesia Postprocedure Evaluation (Signed)
 Anesthesia Post Evaluation    Patient: [facility]    PROCEDURE    Anesthesia type: general    Last Vitals:   Vitals Value Taken Time   BP 116/66 04/10/24 11:03   Temp 36.7 C (98 F) 04/10/24 11:03   Pulse 60 04/10/24 11:03   Resp 13 04/10/24 11:03   SpO2 95 % 04/10/24 11:03                 Anesthesia Post Evaluation:     Patient Evaluated: PACU  Patient Participation: complete - patient participated  Level of Consciousness: sleepy but conscious    Pain Management: adequate  Multimodal analgesia pain management approach    Airway Patency: patent        Anesthetic complications: No      PONV Status: none    Cardiovascular status: acceptable and hemodynamically stable  Respiratory status: acceptable and room air  Hydration status: acceptable  Comments: No apparent anesthetic complications or reportable events          Signed by: Donnice Lard, MD, 04/10/2024 11:08 AM

## 2024-04-10 NOTE — Progress Notes (Signed)
 RADIATION ONCOLOGY PROCEDURE NOTE    Patient Name: Noah Wells  Date of Birth: [birth year]  Medical Record Number: [id number]  Encounter Date: 04/10/2024    PROCEDURE:     Transperineal placement of BioProtect bioabsorbable rectal spacer into perirectal fat space and prostate fiducial markers    PREOPERATIVE DIAGNOSIS:     Prostate cancer (C61)    POSTOPERATIVE DIAGNOSIS:     Prostate cancer (C61)    ANESTHESIA:     General    INDICATION:     He is a 73 y.o. male with diagnosed prostate cancer. He has elected for EBRT as part of his cancer management. Rectal spacer is being placed to decrease the risk of rectal complications. Fiducials are being placed to enhance the accuracy of radiation delivery. Prior to implantation prophylactic antibiotics were prescribed.    PROCEDURE:     After informed consent was obtained and a two factor verification was completed, a time out was done with the operating room team. The patient was positioned in the dorsal lithotomy position. The patient was placed under general anesthesia.    The perineum was prepped and the scrotum was displaced superiorly out of the sterile field. A transrectal ultrasound probe was then inserted into the rectum and attached to the stepper unit. A total of 2 needles were placed using transrectal ultrasound guidance through the perineum. Within these needles, a total of 4 fiducial markers were placed - 2 markers were placed within the right and left base and 2 markers were placed within the right and left apex.    Under transrectal ultrasound guidance, a 20cm 18G guide needle was passed transperineally through the rectourethralis muscle and the needle tip advanced to the prostate. Using a small skin incision, a tapered dilator was then inserted over the guide needle into the space between the rectum and the prostate. Positioning was confirmed in both sagittal and axial planes.  While maintaining the desired position, the dilator was removed from the  device sheath. The BioProtect rectal spacer was then passed through the sheath and positioned directly under the prostate.     Under ultrasound guidance (sagittal plane), 3cc saline was injected to partially fill the BioProtect rectal spacer in the space between the rectum and prostate (Denonvilliers' fascia and the anterior rectal wall). Ultrasound imaging was then switched to the axial plane and a smooth, continuous injection of 17cc of 0.9 sodium chloride  (saline) was used to fully unfurl the spacer.    After visual confirmation of the spacer being positioned midline, the ultrasound image was switched to the sagittal view and spacer height was noted. The BioProtect rectal spacer was then sealed and a final measurement of the space between the prostate and rectum was taken. The sheath, spacer delivery device, and ultrasound probe were then removed.    Standard wound closure with Dermabond and Steri-Strips was completed without issue. A digital rectal examination was performed to confirm rectal mucosa integrity. No suspected penetration or compromise of the rectal wall occurred.    The patient tolerated the procedure well and left the room in stable condition. He will undergo radiation planning in the coming days and receive radiation therapy shortly thereafter.                    Toribio MICAEL Cramp, MD [facility]        Attending Radiation Oncologist        Department of Advanced Radiation Oncology & Proton Therapy  [facility]        [address]        [place], [place] [address]             T [phone]  F [phone]        [email or web address]

## 2024-04-10 NOTE — Discharge Instr - AVS First Page (Signed)
 Please see additional packet from Dr. Zena    Post Anesthesia Discharge Instructions    Although you may be awake and alert in the recovery room, small amounts of anesthetic remain in your system for about 24 hours. You may feel tired and sleepy during this time.    You are advised to go directly home from the hospital.    Plan to stay at home and rest for the remainder of the day.    It is advisable to have someone with you at home for 24 hours after surgery.    Do not operate a motor vehicle, or any mechanical or electrical equipment for the next 24 hours.    Be careful when you are walking around, you may become dizzy. The effects of anesthesia and/or medications are still present and drowsiness may occur.    Do not consume alcohol, tranquilizers, sleeping medications, or any other non prescribed medications for the remainder of the day.    Diet: Begin with liquids, progress your diet as tolerated or as directed by your surgeon. Nausea and vomiting may occur in the next 24 hours.

## 2024-04-10 NOTE — Progress Notes (Signed)
 Post-operative Instructions     General Instructions:     Following sedation your judgement, perception and coordination are considered impaired.  Even though you may feel awake and alert, you are considered legally intoxicated.  Therefore, until the next morning,  Do not drive.  Do not operate appliances or equipment that requires reaction time (e.g., stove, electrical tools, machinery).  Do not smoke if alone.  Do not drink alcoholic beverages.  Go directly home and rest for several hours before resuming your routine activities.  It is highly recommended to have a responsible adult stay with you for the next 24 hours.    DRESSING:  You may have small white sterile strips dressing at your perineal area incision, these sterile strips will fall off itself.  You can take showers but avoid soak in bathtub or swimming for about 2 weeks     DIET:     Regular, unless you are on a special diet for other reasons     ACTIVITY:     Avoid heavy lifting or strenuous physical activity for the first two days after procedure. You may then return to your normal activity level.     POSSIBLE SIDE EFFECTS:     Tenderness, swelling or pain may occur at the IV site where you received sedation.  If you experience this, apply warm soaks to the area.  Notify your physician if this persists.     There are very few side effects from the procedure; however, slight burning on urination, urinating more frequently, blood in the ejaculate or soreness at the insertion site in the perineal area could occur.  These symptoms could last for up to a week.  Many patients also report a feeling of fullness or an unusual (not painful or uncomfortable) sensation in the rectum during bowel movements for the first week or so, which is the body adjusting the the spacer presence.      You are likely to see blood in your urine after the procedure for the first twenty-four hours. If after twenty-four hours visible blood persists, or if you begin to pass blood  clots, you should contact your radiation oncologist.      More rarely, the gel can infiltrate into the surrounding structures, like the prostate or the rectum. If there is only a small amount of infiltration, no intervention is usually needed (the gel is absorbed/degraded by the body), but in rare cases where there is significant infiltration, more investigation can be needed with dedicated MRI and/or colonoscopy. And this can lead to a delay in starting radiation.     Call your healthcare provider right away if you have:  Increasing pain or pain that doesn't get better after taking over-the-counter pain medication   A fever of 100.4 F (38 C) or higher   Chills   Trouble urinating   Blood in your stool (poop) or urine (pee)   Dizziness     FOLLOW-UP:       You will return to the radiation oncology office for your simulation within about a week of the spacer/ fiducials placement procedure. If you do not have that appointment, please notify the scheduler.      If you have any questions/concerns, please call [phone] [facility]) [phone] Tandy) in case of emergency after hours, the Radiation Oncology Physician on call can be paged by calling [phone]        Your CT Simulation and MRI Simulation are scheduled for Tuesday, April 16, 2024 at 11:00am  at [facility].

## 2024-04-10 NOTE — Progress Notes (Signed)
 Discharge criteria met. Patient is awake and alert. Pain level is controlled to tolerable level. Respirations easy and non labored. All vital signs remain stable. Surgical site clean dry and intact. Family is at bedside. Discharge instructions printed and discussed with patient and family, both acknowledged understanding of instructions. Copy of instructions provided to patient. Peripheral IV removed. Patient escorted to main lobby by RN via wheelchair, family member to drive patient home.       Dr. Starlet verified for patient to resume home dose of Glipizide and metformin tonight and all blood pressure meds in the morning.

## 2024-04-10 NOTE — Progress Notes (Signed)
 PATIENT TRANSFERRED TO PHASE 2. VSS, Patient denies any pain. Report given to Olam, Charity fundraiser.

## 2024-04-10 NOTE — Progress Notes (Signed)
 H&P reviewed, no changes.

## 2024-04-15 ENCOUNTER — Ambulatory Visit
Admission: RE | Admit: 2024-04-15 | Discharge: 2024-04-15 | Disposition: A | Discharge status: Home / Self Care | Source: Ambulatory Visit | Attending: Urology | Admitting: Urology

## 2024-04-15 DIAGNOSIS — Z510 Encounter for antineoplastic radiation therapy: Secondary | ICD-10-CM | POA: Insufficient documentation

## 2024-04-15 DIAGNOSIS — E119 Type 2 diabetes mellitus without complications: Secondary | ICD-10-CM | POA: Insufficient documentation

## 2024-04-15 DIAGNOSIS — C61 Malignant neoplasm of prostate: Secondary | ICD-10-CM | POA: Insufficient documentation

## 2024-04-16 ENCOUNTER — Inpatient Hospital Stay: Admission: RE | Admit: 2024-04-16 | Payer: Self-pay | Source: Ambulatory Visit | Admitting: Radiation Oncology

## 2024-04-18 ENCOUNTER — Inpatient Hospital Stay: Admission: RE | Admit: 2024-04-18 | Payer: Self-pay | Source: Ambulatory Visit | Admitting: Radiation Oncology

## 2024-04-22 ENCOUNTER — Encounter: Payer: Self-pay | Admitting: Radiation Oncology

## 2024-05-01 ENCOUNTER — Encounter: Payer: Self-pay | Admitting: Radiation Oncology

## 2024-05-01 ENCOUNTER — Inpatient Hospital Stay
Admission: RE | Admit: 2024-05-01 | Discharge: 2024-05-01 | Disposition: A | Discharge status: Home / Self Care | Payer: Self-pay | Source: Ambulatory Visit

## 2024-05-03 ENCOUNTER — Inpatient Hospital Stay: Admission: RE | Admit: 2024-05-03 | Payer: Self-pay | Source: Ambulatory Visit

## 2024-05-06 ENCOUNTER — Ambulatory Visit: Payer: Self-pay

## 2024-05-07 ENCOUNTER — Inpatient Hospital Stay: Admission: RE | Admit: 2024-05-07 | Payer: Self-pay | Source: Ambulatory Visit

## 2024-05-08 ENCOUNTER — Ambulatory Visit: Payer: Self-pay

## 2024-05-08 NOTE — Patient Instructions (Signed)
 Discharge Instructions after Radiation to the Pelvis for Prostate Cancer     You have completed 5/5 radiation treatments to your prostate beginning on 05/01/2024 and ending on 05/13/2024.  You were treated by Dr. Chinita.  You can make your follow up appointment with Lauraine Everts, FNP in 3 months in the radiation oncology office [facility] Dr. Elysa P-3).  Please call us  at [phone] if you have any scheduling questions, or [phone] for any nursing/ medical questions.     We will send a summary of your radiation treatment to all of the physicians you have told us  are involved in your care.  Please make a follow-up appointment with Dr. Hale and any others as appropriate.      SKIN  It is not very common to have skin problems after this treatment.  If you do have some redness, dryness, or irritation in the groin or lower abdomen, you may use a moisturizer of your choice.  You may also use Gold Bond powder in any skin folds that become irritated.      URINARY SIDE EFFECTS  Urinary frequency and urgency may continue for several weeks.  Continue any new prescriptions such as tamsulosin  (Flomax ) that we may have given you until your follow up appointment.  You may also experience burning or pain with urination for several weeks.  If you were experiencing burning with urination during treatment and this was discussed with your doctor, you can use an over the counter medicine called "Azo" as needed (no prescription required).  We also recommend limiting caffeine or alcohol as these may worsen painful urination.  If the burning is severe, please call our office for further instructions.     BOWEL SIDE EFFECTS  If having loose stools, you may use Imodium as needed, and contact our office if that is not sufficient. Also follow a low residue diet.  Bowel issues will likely resolve in 2-4 weeks.  If you experience rectal irritation, be sure to gently clean the rectal area after bowel movements.  Notify us  if  your bowel movements are painful.     PAIN  Please notify our office if you develop new or worsening pain in the pelvic area.  Some mild cramping may be normal.      ACTIVITY   Fatigue, feelings of weakness or weariness experienced during treatment usually resolve over 4-6 weeks or longer.  Periods of light activity, such as a short walk or gentle stretching routine, followed by periods of rest, are helpful for fighting fatigue.  We do not recommend staying in bed or sitting all day.  Resume or continue your usual daily routine activities as tolerated.      DIET  Follow the low residue diet if having any bowel issues. Also limit caffeine and alcohol for urinary symptoms. Your body is recovering from treatment and needs plenty of calories and good nutrition to heal and rebuild healthy tissue.  Our dietician can provide guidance if you need further instruction.     MEDICATION   Continue taking your current medications unless instructed otherwise.  For refills, it is generally best to call the office of the prescribing physician.

## 2024-05-08 NOTE — Progress Notes (Unsigned)
 RADIATION ONCOLOGY ON-TREATMENT VISIT NURSING INTAKE    Patient Name: Noah Wells  Date of Birth: March 07, 1951  Medical Record Number: [id number]  Encounter Date: 05/09/2024  Primary Radiation Oncologist: Zena Miu, MD [facility]       Consulting Physicians:   Referring physician or provider: Quince Neer, MD  Primary care physician or provider: Wyatt Evalene BIRCH, MD  Urology: Quince Neer, MD  Medical oncology: Fonda Dawn, MD    DIAGNOSIS:     [name] is a 73 y.o. male with favorable intermediate risk prostate cancer.     RADIOTHERAPY TREATMENT:     Intent: Curative  Modality: Photons - CK              Prescribed sum dose: 3625 cGy  Current dose delivered: 2900 cGy  Current fractions delivered: 4/5    SYSTEMIC THERAPY:     Not receiving concurrent systemic therapy    SUBJECTIVE:     Interpreter required: {RadOnc Interpreter:54665}    Week 1: ***    OBJECTIVE:     Vitals  There were no vitals filed for this visit.  Weight  Current weight:    Wt Readings from Last 10 Encounters:   03/25/24 97.1 kg (214 lb)   03/07/24 98.6 kg (217 lb 6.4 oz)   02/29/24 98.7 kg (217 lb 9.6 oz)   02/29/24 98.7 kg (217 lb 9.6 oz)   02/29/24 98.7 kg (217 lb 9.6 oz)   01/23/24 97.1 kg (214 lb)   01/17/24 96.8 kg (213 lb 6.5 oz)   01/02/24 96.6 kg (213 lb)   11/23/23 96.2 kg (212 lb)   01/03/23 98.9 kg (218 lb)       Pain: {RadOnc Pain Scale:54155} out of 10  KPS: {RadOnc KPS Selections:54154}    TOXICITY:     General/Constitutional:  Fatigue: {RadOnc Fatigue Toxicity:54582::0- None}  Anorexia: {RadOnc Anorexia Toxicity:54538::0- None}  Dehydration: {RadOnc Dehydration Toxicity:54560::0- None}    Gastrointestinal:  Abdominal pain: {RadOnc Abdominal Pain Toxicity:54534::0- None}  Bloating: {RadOnc Bloating Toxicity:54541::0- None}  Nausea: {RadOnc Nausea Toxicity:54601::0- None}  Vomiting: {RadOnc Vomiting Toxicity:54623::0- None}  Baseline # of bowel movements/day: ***  Current # of bowel movements/day:  ***  Diarrhea: {RadOnc Diarrhea Toxicity:54568::0- None}  Constipation: {RadOnc Constipation Toxicity:54549::0- None}  Fecal incontinence: {RadOnc Fecal Incontinence Toxicity:54583::0- None}  Rectal pain: {RadOnc Rectal Pain Toxicity:54610::0- None}  Rectal hemorrhage: {RadOnc Rectal Hemorrhage Toxicity:54608::0- None}  Rectal mucositis: {RadOnc Rectal Mucositis Toxicity:54609::0- None}    Genitourinary:  Urinary tract pain: {RadOnc Urinary Tract Pain Toxicity:54617::0- None}  Urinary frequency: {RadOnc Urinary Frequency Toxicity:54614::0- None}  Urinary urgency: {RadOnc Urinary Urgency Toxicity:54618::0- None}  Urinary incontinence: {RadOnc Urinary Incontinence Toxicity:54615::0- None}  Urinary retention: {RadOnc Urinary Retention Toxicity:54616::0- None}  Hematuria: {RadOnc Hematuria Toxicity:54588::0- None}  Baseline nocturia: ***  Current level of nocturia: {RadOnc Nocturia:55436}    Skin:  Radiation dermatitis: {RadOnc Derm Radiation Toxicity:54567::0- None}    Chemotherapy related:  Oral mucositis: {RadOnc Mucositis Oral Toxicity:54599::0- None}  Peripheral sensory neuropathy: {RadOnc Peripheral Sensory Neuropathy Toxicity:54604::0- None}    Hormone therapy related:  Agitation: {RadOnc Agitation Toxicity:54535::0- None}  Gynecomastia: {RadOnc Gynecomastia Toxicity:54586::0- None}  Hot flashes: {RadOnc Hot Flashes Toxicity:54590::0- None}    Treatment break of at least 3 consecutive days:  -{RadOnc Yes/No Selections:54157::No}    RECOMMENDATIONS:     *Pain  -{RadOnc Pain Management Selections:54344}    *Urinary modifiers  -Flomax : {RadOnc Yes/No/NA Selections:55107::No}    *Bowel  -Continue bowel regimen    My-duyen ELINORE Miyamoto, RN

## 2024-05-09 ENCOUNTER — Other Ambulatory Visit: Payer: Self-pay | Admitting: Family Nurse Practitioner

## 2024-05-09 ENCOUNTER — Inpatient Hospital Stay: Admission: RE | Admit: 2024-05-09 | Payer: Self-pay | Source: Ambulatory Visit | Admitting: Radiation Oncology

## 2024-05-09 ENCOUNTER — Encounter: Payer: Self-pay | Admitting: Radiation Oncology

## 2024-05-09 ENCOUNTER — Inpatient Hospital Stay: Admission: RE | Admit: 2024-05-09 | Payer: Self-pay | Source: Ambulatory Visit

## 2024-05-09 DIAGNOSIS — C61 Malignant neoplasm of prostate: Secondary | ICD-10-CM

## 2024-05-09 MED ORDER — TAMSULOSIN HCL 0.4 MG PO CAPS: 0.4000 mg | ORAL_CAPSULE | Freq: Every day | ORAL | 2 refills | Status: DC

## 2024-05-09 NOTE — Progress Notes (Signed)
 RADIATION ONCOLOGY ON-TREATMENT VISIT    Patient Name: Noah Wells  Date of Birth: 1951-02-18  Medical Record Number: [id number]  Encounter Date: 05/09/2024  Primary Radiation Oncologist: Zena Miu, MD [facility]       Consulting Physicians:   Referring physician or provider: Quince Neer, MD  Primary care physician or provider: Wyatt Evalene BIRCH, MD  Urology: Quince Neer, MD  Medical oncology: Fonda Dawn, MD    DIAGNOSIS:     [name] is a 73 y.o. male with favorable intermediate risk prostate cancer.     RADIOTHERAPY TREATMENT:     Intent: Curative  Modality: Photons - CK              Prescribed sum dose: 3625 cGy  Current dose delivered: 2900 cGy  Current fractions delivered: 4/5    SYSTEMIC THERAPY:     Not receiving concurrent systemic therapy    SUBJECTIVE:     Interpreter required: No interpreter was required for this visit    Week 1: Mahki reports more burning with urination and urgency and frequency. Denies hematuria. Nobowel changes.     OBJECTIVE:     Vitals  There were no vitals filed for this visit.  Weight  Current weight:    Wt Readings from Last 10 Encounters:   03/25/24 97.1 kg (214 lb)   03/07/24 98.6 kg (217 lb 6.4 oz)   02/29/24 98.7 kg (217 lb 9.6 oz)   02/29/24 98.7 kg (217 lb 9.6 oz)   02/29/24 98.7 kg (217 lb 9.6 oz)   01/23/24 97.1 kg (214 lb)   01/17/24 96.8 kg (213 lb 6.5 oz)   01/02/24 96.6 kg (213 lb)   11/23/23 96.2 kg (212 lb)   01/03/23 98.9 kg (218 lb)       Pain: 0 out of 10  KPS: 100 - Normal, no complaints    TOXICITY:     General/Constitutional:  Fatigue: 1- Fatigue relieved by rest  Anorexia: 0- None  Dehydration: 0- None    Gastrointestinal:  Abdominal pain: 1- Mild pain  Bloating: 0- None  Nausea: 0- None  Vomiting: 0- None  Baseline # of bowel movements/day: 1  Current # of bowel movements/day: 2  Diarrhea: 0- None  Constipation: 0- None  Fecal incontinence: 0- None  Rectal pain: 0- None  Rectal hemorrhage: 0- None  Rectal mucositis: 0-  None    Genitourinary:  Urinary tract pain: 1- Mild pain  Urinary frequency: 1- Present  Urinary urgency: 1- Present  Urinary incontinence: 0- None  Urinary retention: 0- None  Hematuria: 0- None  Baseline nocturia: 1-2  Current level of nocturia: 6    Skin:  Radiation dermatitis: 0- None    Treatment break of at least 3 consecutive days:  -No    RECOMMENDATIONS:     *Pain  -Not applicable, patient not currently in pain    *Urinary modifiers  -Flomax : ordered on 05/09/24. Begin with 0.4 mg Flomax  nightly, can then add a second dose as needed, either in the evening or the morning.  If this is ineffective, would begin Aleve, 2 tabs twice daily with food, if this is ineffective he is to call me.    *Bowel  -Continue bowel regimen    PLAN:     *Radiotherapy treatment  -Radiotherapy will continue as prescribed  -Discussed expectations of treatment plan, radiotherapy course, and side effects    *Follow-up  -Follow-up in radiation oncology clinic with APP 54mo after the completion of  radiotherapy w/PSA  -Discussed follow-up plan  -Follow-up with other physicians and care team providers  -Patient will reach out via MyChart with any questions                    Zena Miu, MD [facility]        Attending Radiation Oncologist        Department of Advanced Radiation Oncology & Proton Therapy        [facility]        [address]        [place], [place] [address]             T [phone]  F [phone]        [email or web address]

## 2024-05-10 ENCOUNTER — Ambulatory Visit: Payer: Self-pay | Admitting: Radiation Oncology

## 2024-05-13 ENCOUNTER — Encounter: Payer: Self-pay | Admitting: Radiation Oncology

## 2024-05-13 ENCOUNTER — Inpatient Hospital Stay
Admission: RE | Admit: 2024-05-13 | Discharge: 2024-05-13 | Disposition: A | Discharge status: Home / Self Care | Payer: Self-pay | Source: Ambulatory Visit | Admitting: Radiation Oncology

## 2024-05-13 NOTE — Progress Notes (Signed)
 RADIATION ONCOLOGY END OF TREATMENT NOTE    Patient Name: Noah Wells  Date of Birth: [birth year]  Medical Record Number: [id number]  Encounter Date: 05/13/2024  Primary Radiation Oncologist: Zena Miu, MD [facility]       Consulting Physicians:   Referring physician or provider: Quince Neer, MD  Primary care physician or provider: Wyatt Evalene BIRCH, MD  Urology: Quince Neer, MD  Medical oncology: Fonda Dawn, MD    DIAGNOSIS:     Noah Wells is a 73 year old man with favorable intermediate risk prostate cancer (cT1 cN0 M0, PSA 6.1, Gleason 3+4 in 4/14 standard cores and the lesional core, decipher 0.37. He completed CK in 5 fractions every other day over 10 day period.      RADIOTHERAPY TREATMENT:     Intent: Curative  Modality: Photons - CK  Treatment Dates: 05/01/24-05/13/24 qod              SYSTEMIC THERAPY:     Not receiving concurrent systemic therapy    SUBJECTIVE:     Week 1: Noah Wells reports more burning with urination and urgency and frequency. Denies hematuria. Nobowel changes.     TOXICITY:     General/Constitutional:  Fatigue: 1- Fatigue relieved by rest  Anorexia: 0- None  Dehydration: 0- None    Gastrointestinal:  Abdominal pain: 1- Mild pain  Bloating: 0- None  Nausea: 0- None  Vomiting: 0- None  Baseline # of bowel movements/day: 1  Current # of bowel movements/day: 2  Diarrhea: 0- None  Constipation: 0- None  Fecal incontinence: 0- None  Rectal pain: 0- None  Rectal hemorrhage: 0- None  Rectal mucositis: 0- None    Genitourinary:  Urinary tract pain: 1- Mild pain  Urinary frequency: 1- Present  Urinary urgency: 1- Present  Urinary incontinence: 0- None  Urinary retention: 0- None  Hematuria: 0- None  Baseline nocturia: 1-2  Current level of nocturia: 6    Skin:  Radiation dermatitis: 0- None    Treatment break of at least 3 consecutive days:  -No    RECOMMENDATIONS:     *Pain  -Not applicable, patient not currently in pain    *Urinary modifiers  -Flomax : ordered on 05/09/24. Begin with 0.4 mg  Flomax  nightly, can then add a second dose as needed, either in the evening or the morning.  If this is ineffective, would begin Aleve, 2 tabs twice daily with food, if this is ineffective he is to call me.    *Bowel  -Continue bowel regimen    PLAN:     *Radiotherapy treatment  -Radiotherapy completed.  -Discussed expectations of treatment plan, radiotherapy course, and side effects    *Follow-up  -Follow-up in radiation oncology clinic with APP 40mo after the completion of radiotherapy w/PSA  -Discussed follow-up plan  -Follow-up with other physicians and care team providers  -Patient will reach out via MyChart with any questions                    Zena Miu, MD [facility]        Attending Radiation Oncologist        Department of Advanced Radiation Oncology & Proton Therapy        [facility]        [address]        [place], [place] [address]             T [phone]  F [phone]  [email or web address]

## 2024-05-22 ENCOUNTER — Encounter (INDEPENDENT_AMBULATORY_CARE_PROVIDER_SITE_OTHER): Payer: Self-pay

## 2024-07-15 ENCOUNTER — Ambulatory Visit
Admission: RE | Admit: 2024-07-15 | Discharge: 2024-07-15 | Disposition: A | Source: Ambulatory Visit | Attending: Urology | Admitting: Urology

## 2024-07-15 ENCOUNTER — Other Ambulatory Visit: Payer: Self-pay | Admitting: Urology

## 2024-07-15 DIAGNOSIS — C61 Malignant neoplasm of prostate: Secondary | ICD-10-CM | POA: Insufficient documentation

## 2024-07-16 ENCOUNTER — Other Ambulatory Visit: Payer: Self-pay

## 2024-07-16 LAB — PSA TOTAL, ANNUAL SCREENING: Prostate Specific Antigen, Total: 1.5 ng/mL (ref <=4.00)

## 2024-07-18 ENCOUNTER — Other Ambulatory Visit: Payer: Self-pay | Admitting: Radiation Oncology

## 2024-08-04 NOTE — Progress Notes (Unsigned)
 RADIATION ONCOLOGY CLINIC NOTE    Patient Name: Noah Wells  Date of Birth: [birth year]  Medical Record Number: [id number]  Encounter Date: 08/05/2024  Primary Radiation Oncologist: Zena Miu, MD [facility]       Consulting Physicians:   Referring physician or provider: Quince Neer, MD  Primary care physician or provider: Wyatt Evalene BIRCH, MD  Urology: Quince Neer, MD  Medical oncology: Fonda Dawn, MD      IDENTIFYING HISTORY: .Masoud Nyce is a 73 y.o. 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 He completed CK in 5 fractions ending on 05/13/24.    ONCOLOGIC HISTORY:  ***    INTERVAL HISTORY:     Neko Boyajian returns to the clinic for a *** month follow up appointment with ***.    PERFORMANCE STATUS:  ECOG: ***  KPS: ***%    **add intake here    Current Outpatient Medications   Medication Instructions    allopurinol (ZYLOPRIM) 300 mg, Daily    amLODIPine  (NORVASC ) 5 mg, Oral, Daily    aspirin EC 81 mg, Daily    atorvastatin  (LIPITOR) 10 mg, Oral, Daily    Empagliflozin-metFORMIN HCl (Synjardy) 12.12-998 MG Tablet 2 tablets, Daily    glipiZIDE (GLUCOTROL) 10 mg, 2 times daily    hydroCHLOROthiazide (HYDRODIURIL) 25 mg, Daily    loratadine (CLARITIN) 10 mg, Daily    losartan (COZAAR) 50 MG tablet     metoprolol succinate (TOPROL-XL) 100 mg, 2 times daily    tamsulosin  (FLOMAX ) 0.4 mg, Oral, Daily after dinner         Allergies[1]      PHYSICAL EXAMINATION:  Vitals: There were no vitals taken for this visit.      11/23/2023 01/02/2024 01/17/2024 01/23/2024 02/29/2024 03/07/2024 03/25/2024   Weight Monitoring   Height 188 cm 188 cm 188 cm 188 cm 188 cm    188 cm    188 cm  188 cm   Height Method   Stated       Weight 96.163 kg 96.616 kg 96.8 kg 97.07 kg 98.703 kg    98.703 kg    98.703 kg 98.612 kg 97.07 kg   Weight Method   Standing Scale       BMI (calculated) 27.2 kg/m2 27.3 kg/m2 27.4 kg/m2 27.5  kg/m2 27.9 kg/m2    27.9 kg/m2    27.9 kg/m2  27.5 kg/m2       Multiple values from one day are sorted in reverse-chronological order             PAIN SCORE: ***    General: Well-appearing, pleasant male, in no acute distress.  Psychiatric: Alert and oriented x3, thought content appropriate, euthymic.  Head: Normocephalic, without obvious abnormality, atraumatic.  Eyes: Conjunctivae and sclerae clear without icterus. Extraocular movements intact.  Lungs: Work of breathing appears unlabored.  Skin: Skin color appears normal.       IMAGING STUDIES: The following films and/or reports have been personally reviewed: ***No imaging to review currently    ASSESSMENT:  73 y.o. 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 He completed CK in 5 fractions ending on 05/13/24.    Disease Control: ***    Side Effects: ***    Other Issues: ***    Pain action plan: ***No intervention necessary.  PLAN:  -Follow up with Radiation Oncology APP* in ***    My total visit time for this patient encounter was *** min. This includes time spent preparing to see the patient, performing the exam, counselling patient/family, ordering and reviewing tests, medications and/or procedures, care coordination, and documenting clinical information in the record.                    Lauraine DOROTHA Everts, DNP, FNP        Advanced Practice Provider        Department of Radiation Oncology        [facility]        [address]        [place], [place]  [address]             T [phone]  F [phone]        [email or web address]            [1]   Allergies  Allergen Reactions    Penicillins Other (See Comments)     Junior year in college-1974, unknown reaction

## 2024-08-05 ENCOUNTER — Inpatient Hospital Stay: Admission: RE | Admit: 2024-08-05 | Payer: Self-pay | Admitting: Family Nurse Practitioner

## 2024-08-05 ENCOUNTER — Encounter: Payer: Self-pay | Admitting: Family Nurse Practitioner

## 2024-08-05 VITALS — BP 125/61 | HR 58 | Temp 97.4°F | Wt 217.6 lb

## 2024-08-05 DIAGNOSIS — C61 Malignant neoplasm of prostate: Secondary | ICD-10-CM

## 2024-08-05 NOTE — Addendum Note (Signed)
 Encounter addended by: Laney Gilford, RN on: 08/05/2024 2:12 PM   Actions taken: Flowsheet accepted

## 2024-08-05 NOTE — Progress Notes (Signed)
 08/05/24 1132   Radiation Therapy Patient Care    Planned Total Dose 36.25Gy/5Fx CK prostate EOT 05/13/24   Comfort Alteration   Karnofsky Performance Score 90%-can perform normal activity, minor signs of disease   Fatigue 1   Pain Score 0   Elimination Alteration    Diarrhea 0-none   BMs/day  2   Rectal Bleeding 0 - None   Skin Alteration   Radiation Dermatitis 0-none   Renal/Genitourinary   Urinary Frequency, Urgency 0-normal  (significant issues with urinary straining unless on Flomax  0.4mg  qhs)   Dysuria 0-none   Daytime Frequency 2-4 hours   Vital signs   Temp 97.4 F (36.3 C)   Heart Rate (!) 58   BP 125/61     IPSS (baseline: 5)  How often have you had the sensation of not emptying your bladder?: not at all  How often have you had to urinate less than every two hours?: not at all  How often have you found you stopped and started again several times when you urinated?: not at all  How often have you found it difficult to postpone urination?: not at all  How often have you had a weak urinary stream?: not at all  How often have you had to strain to start urination?: not at all  How many times did you typically get up at night to urinate?: 1 time  I-PSS Score: 1  If you were to spend the rest of your life with your urinary condition just the way it is now, how would you feel about that?: pleased    SHIM (baseline: 12)  How do you rate your confidence that you could get  and keep an erection?: low  When you had erections with sexual stimulation, how often were your erections hard enough for penetration (entering your partner)?: a few times (much less than half the time)  During sexual intercourse, how often were you able to maintain your erection after you had penetrated (entered) your partner?: a few times (much less than half the time)  During sexual intercourse, how difficult was it to maintain your erection to completion of intercourse?: very difficult  When you attempted sexual intercourse, how often was  it satisfactory for you?: almost never or never  SHIM Score: 9    Lab Results   Component Value Date    PSA 1.50 07/15/2024    PSA 5.55 (H) 03/04/2024     Last saw Hale and Dasie 02/29/24; not currently scheduled for follow-up with either.     Valentin Favre, RN

## 2024-08-06 ENCOUNTER — Ambulatory Visit: Payer: Self-pay | Admitting: Family Nurse Practitioner

## 2024-08-17 ENCOUNTER — Other Ambulatory Visit: Payer: Self-pay | Admitting: Radiation Oncology

## 2024-08-17 DIAGNOSIS — C61 Malignant neoplasm of prostate: Secondary | ICD-10-CM

## 2024-08-19 ENCOUNTER — Encounter: Payer: Self-pay | Admitting: Family Nurse Practitioner

## 2024-08-19 NOTE — Telephone Encounter (Signed)
 This encounter was created in error - please disregard.    Items noted as "reviewed" are for administrative purposes only and are not guaranteed by the provider to be accurate on this date.

## 2024-08-19 NOTE — Telephone Encounter (Signed)
Pt requesting refill of Flomax

## 2024-11-26 ENCOUNTER — Encounter

## 2024-12-03 ENCOUNTER — Ambulatory Visit (INDEPENDENT_AMBULATORY_CARE_PROVIDER_SITE_OTHER): Admitting: Nurse Practitioner

## 2025-02-03 ENCOUNTER — Ambulatory Visit: Payer: Self-pay | Admitting: Family Nurse Practitioner
